# Patient Record
Sex: Male | Born: 1955 | Race: White | Hispanic: No | Marital: Married | Smoking: Never smoker
Health system: Southern US, Community
[De-identification: ages and names within clinical notes are randomized; demographics above are authoritative.]

## PROBLEM LIST (undated history)

## (undated) DIAGNOSIS — M1120 Other chondrocalcinosis, unspecified site: Secondary | ICD-10-CM

## (undated) DIAGNOSIS — Z85528 Personal history of other malignant neoplasm of kidney: Secondary | ICD-10-CM

## (undated) DIAGNOSIS — Z836 Family history of other diseases of the respiratory system: Secondary | ICD-10-CM

## (undated) DIAGNOSIS — M1990 Unspecified osteoarthritis, unspecified site: Secondary | ICD-10-CM

## (undated) DIAGNOSIS — K859 Acute pancreatitis without necrosis or infection, unspecified: Secondary | ICD-10-CM

## (undated) DIAGNOSIS — K219 Gastro-esophageal reflux disease without esophagitis: Secondary | ICD-10-CM

## (undated) HISTORY — DX: Personal history of other malignant neoplasm of kidney: Z85.528

## (undated) HISTORY — PX: OTHER SURGICAL HISTORY: SHX169

## (undated) HISTORY — DX: Acute pancreatitis without necrosis or infection, unspecified: K85.90

---

## 2015-01-31 ENCOUNTER — Ambulatory Visit: Payer: 59 | Attending: Sports Medicine

## 2015-01-31 DIAGNOSIS — M25561 Pain in right knee: Secondary | ICD-10-CM | POA: Diagnosis not present

## 2015-01-31 NOTE — Therapy (Signed)
[place], [place], [address] Phone: [phone]   Fax:  [phone]  Physical Therapy Evaluation  Patient Details  Name: Louis Fox MRN: [id number] Date of Birth: [birth year] Referring Provider:  Verner Chol, MD  Encounter Date: 01/31/2015      PT End of Session - 01/31/15 1316    Visit Number 1   Number of Visits 8   Date for PT Re-Evaluation 03/01/15   PT Start Time 9470   PT Stop Time 1312   PT Time Calculation (min) 37 min   Activity Tolerance Patient tolerated treatment well   Behavior During Therapy [facility] for tasks assessed/performed      No past medical history on file.  No past surgical history on file.  There were no vitals taken for this visit.  Visit Diagnosis:  Pain, joint, knee, right - Plan: PT plan of care cert/re-cert      Subjective Assessment - 01/31/15 1239    Symptoms He feels it was a gout flairup and began to have bilateral knee pain  Now he has some RT knee pain due to use of life .    Pertinent History Gout RT knee pain episode in past. He has to step up railing multiple times per day. He has been taping patella with much benefit with medial pull.    How long can you sit comfortably? As needed   How long can you walk comfortably? As needed but no long distances for exercise (1-2 miles)   Diagnostic tests Xrays    Patient Stated Goals Decreased pain   Currently in Pain? Yes   Pain Score 1    Pain Orientation Right   Pain Descriptors / Indicators Aching   Pain Type --  Sub acute   Pain Onset More than a month ago   Aggravating Factors  Twisting on leg   Pain Relieving Factors Ice ,meds, tape   Effect of Pain on Daily Activities Not walking long distances   Multiple Pain Sites No          [facility] PT Assessment - 01/31/15 1247    Assessment   Medical Diagnosis RT knee swelling, LT knee  contusion/bursitis   Onset Date --  11/2015   Prior Therapy None   Precautions   Precautions None   Restrictions   Weight Bearing Restrictions No   Balance Screen   Has the patient fallen in the past 6 months No   Has the patient had a decrease in activity level because of a fear of falling?  No   Is the patient reluctant to leave their home because of a fear of falling?  No   AROM   Right Knee Extension 180   Left Knee Extension 180   Strength   Right Knee Flexion 5/5   Right Knee Extension 5/5   Left Knee Flexion 5/5   Left Knee Extension 5/5                  [facility] Adult PT Treatment/Exercise - 01/31/15 0001    Iontophoresis   Type of Iontophoresis Dexamethasone   Location medial RT knee   Dose 2cc   Time 4-5 hours                 PT Education - 01/31/15 1316    Education provided Yes   Education Details ionto use and removal.    Person(s) Educated Patient   Methods Explanation  Comprehension Verbalized understanding             PT Long Term Goals - 01/31/15 1319    PT LONG TERM GOAL #1   Title Indepenent wiht all HEP as of last visit   Time 4   Period Weeks   Status New   PT LONG TERM GOAL #2   Title Pain decreased 75% or more to return to wlaking 1 mile or more    Time 4   Period Weeks   Status New               Plan - 01/31/15 1317    Clinical Impression Statement Mild medial knee pain along edge of femoral condyle   Pt will benefit from skilled therapeutic intervention in order to improve on the following deficits Pain;Difficulty walking   Rehab Potential Good   PT Frequency 2x / week   PT Duration 4 weeks   PT Treatment/Interventions Patient/family education;Therapeutic exercise;Ultrasound;Manual techniques  iontophoresis   PT Next Visit Plan [place], ionto, hip strength, review tape   Consulted and Agree with Plan of Care Patient         Problem List There are no active problems to display for this patient.   Darrel Hoover PT  01/31/2015, 1:21 PM  [facility] [address] [place], [place], [address] Phone: [phone]   Fax:  [phone]

## 2015-01-31 NOTE — Patient Instructions (Signed)

## 2015-02-12 ENCOUNTER — Ambulatory Visit: Payer: 59

## 2015-02-12 DIAGNOSIS — M25561 Pain in right knee: Secondary | ICD-10-CM

## 2015-02-12 NOTE — Patient Instructions (Signed)
Hip Extension: Hamstring Single Leg Deadlift (Eccentric)   Holding weights, stand on affected leg with knee slightly flexed. Lift other leg while slowly bending forward at the hip. Use ___ lb weight. ___ reps per set, ___ sets per day, ___ days per week. Add ___ lbs when you achieve ___ repetitions. Touch floor with weight.  Copyright  VHI. All rights reserved.  Squat: Wide Leg   Feet wide apart, toes out, squat, holding weight between knees. Weight should be at ankles.  Bend at the hips with hips going back behind hips. Keep back straight.  Put eight on stool if you cannot get to floor.  Repeat _3-15___ times per set. Do ___1-2_ sets per session. Do __2-5__ sessions per week. Use __0-10__ lb weight.  Copyright  VHI. All rights reserved.  Squat: Half   Arms hanging at sides, squat by dropping hips back as if sitting on a chair. Keep knees over ankles, hips behind heels.  Keep back straight.  Bend at hips and knees will bend with hips.   Repeat ___3-15_ times per set. Do __1-2__ sets per session. Do __2-5__ sessions per week. Use __0-10HIP / KNEE: Flexion / Extension, Squat Unilateral Hip / Glute Extension: Standing - Straight Leg (Machine) Hip Extension (Standing) Healthy Back - Yoga Tree Balance  

## 2015-02-12 NOTE — Therapy (Signed)
[place], [place], [address] Phone: [phone]   Fax:  [phone]  Physical Therapy Treatment  Patient Details  Name: Louis Fox MRN: [id number] Date of Birth: 28-Mar-1956 Referring Provider:  Verner Chol, MD  Encounter Date: 02/12/2015      PT End of Session - 02/12/15 1138    Visit Number 2   Number of Visits 8   Date for PT Re-Evaluation 03/01/15   PT Start Time 1100   PT Stop Time 1140   PT Time Calculation (min) 40 min   Activity Tolerance Patient tolerated treatment well   Behavior During Therapy [facility] for tasks assessed/performed      No past medical history on file.  No past surgical history on file.  There were no vitals taken for this visit.  Visit Diagnosis:  Pain, joint, knee, right      Subjective Assessment - 02/12/15 1056    Symptoms Much better    Currently in Pain? Yes   Pain Score 1    Pain Orientation Right   Multiple Pain Sites No                    [facility] Adult PT Treatment/Exercise - 02/12/15 1140    Exercises   Exercises --  hip hinge double and single leg    Ultrasound   Ultrasound Location Medial RT knee   Ultrasound Parameters 100% 3 MHz 1.2 Wcm2   Ultrasound Goals Pain   Iontophoresis   Type of Iontophoresis Dexamethasone   Location medial RT knee   Dose 2cc   Time 4-5 hours                 PT Education - 02/12/15 1138    Education provided Yes   Education Details hip hinge   Person(s) Educated Patient   Methods Explanation;Demonstration;Tactile cues;Verbal cues;Handout   Comprehension Returned demonstration;Verbalized understanding             PT Long Term Goals - 01/31/15 1319    PT LONG TERM GOAL #1   Title Indepenent wiht all HEP as of last visit   Time 4   Period Weeks   Status New   PT LONG TERM GOAL #2   Title Pain decreased 75% or more to return to wlaking 1 mile or more    Time 4   Period Weeks    Status New               Plan - 02/12/15 1139    Clinical Impression Statement Pain improving .    Consulted and Agree with Plan of Care Patient        Problem List There are no active problems to display for this patient.   Darrel Hoover PT 02/12/2015, 11:42 AM  [facility] [address] [place], [place], [address] Phone: [phone]   Fax:  [phone]

## 2015-02-14 ENCOUNTER — Ambulatory Visit: Payer: 59

## 2015-02-14 DIAGNOSIS — M25561 Pain in right knee: Secondary | ICD-10-CM | POA: Diagnosis not present

## 2015-02-14 NOTE — Therapy (Signed)
[place], [place], [address] Phone: [phone]   Fax:  [phone]  Physical Therapy Treatment  Patient Details  Name: Louis Fox MRN: [id number] Date of Birth: 1956-03-15 Referring Provider:  Verner Chol, MD  Encounter Date: 02/14/2015      PT End of Session - 02/14/15 1054    Visit Number 3   Number of Visits 8   Date for PT Re-Evaluation 03/01/15   PT Start Time 0930   PT Stop Time 1005   PT Time Calculation (min) 35 min   Activity Tolerance Patient tolerated treatment well   Behavior During Therapy [facility] for tasks assessed/performed      No past medical history on file.  No past surgical history on file.  There were no vitals taken for this visit.  Visit Diagnosis:  Pain, joint, knee, right                  [facility] Adult PT Treatment/Exercise - 02/14/15 1052    Ultrasound   Ultrasound Location medial RT knee   Ultrasound Parameters 100% 3 MHZ 1.2 Wcm2   Ultrasound Goals Pain   Iontophoresis   Type of Iontophoresis Dexamethasone   Location medial RT knee   Dose 2cc   Time 4-5 hours                      PT Long Term Goals - 01/31/15 1319    PT LONG TERM GOAL #1   Title Indepenent wiht all HEP as of last visit   Time 4   Period Weeks   Status New   PT LONG TERM GOAL #2   Title Pain decreased 75% or more to return to wlaking 1 mile or more    Time 4   Period Weeks   Status New               Plan - 02/14/15 1054    Clinical Impression Statement No real pain but wants to have another treatment and see how does into next week. Did the exercise without increase pain   PT Next Visit Plan modalities    Consulted and Agree with Plan of Care Patient        Problem List There are no active problems to display for this patient.   Darrel Hoover PT 02/14/2015, 10:56 AM  [facility] [address] [place], [place], [address] Phone: [phone]   Fax:  [phone]

## 2015-02-18 ENCOUNTER — Telehealth: Payer: Self-pay | Admitting: *Deleted

## 2015-02-18 NOTE — Therapy (Addendum)
[place], [place], [address] Phone: [phone]   Fax:  [phone]  Physical Therapy Treatment  Patient Details  Name: Louis Fox MRN: [id number] Date of Birth: 06-03-1956 Referring Provider:  Verner Chol, MD  Encounter Date: 02/14/2015    No past medical history on file.  No past surgical history on file.  There were no vitals taken for this visit.  Visit Diagnosis:  Pain, joint, knee, right                               PT Long Term Goals - 01/31/15 1319    PT LONG TERM GOAL #1   Title Indepenent wiht all HEP as of last visit   Time 4   Period Weeks   Status New   PT LONG TERM GOAL #2   Title Pain decreased 75% or more to return to wlaking 1 mile or more    Time 4   Period Weeks   Status New        .PHYSICAL THERAPY DISCHARGE SUMMARY  Visits from Start of Care: 5  Current functional level related to goals / functional outcomes: He reports he is doing well and discharged self. Goal met   Remaining deficits: None   Education / Equipment: HEP  Plan: Patient agrees to discharge.  Patient goals were met. Patient is being discharged due to being pleased with the current functional level.  ?????           Problem List There are no active problems to display for this patient.   Darrel Hoover PT 02/18/2015, 1:28 PM  [facility] [address] [place], [place], [address] Phone: [phone]   Fax:  [phone]

## 2015-02-18 NOTE — Telephone Encounter (Signed)
pt called to cancel all his appts stated that he's doing much better...td

## 2015-09-24 ENCOUNTER — Encounter (HOSPITAL_COMMUNITY): Payer: Self-pay | Admitting: Emergency Medicine

## 2015-09-24 ENCOUNTER — Observation Stay (HOSPITAL_COMMUNITY)
Admission: EM | Admit: 2015-09-24 | Discharge: 2015-09-28 | Disposition: A | Discharge status: Home / Self Care | Payer: 59 | Attending: Internal Medicine | Admitting: Internal Medicine

## 2015-09-24 ENCOUNTER — Emergency Department (HOSPITAL_COMMUNITY): Payer: 59

## 2015-09-24 DIAGNOSIS — K2980 Duodenitis without bleeding: Secondary | ICD-10-CM | POA: Diagnosis present

## 2015-09-24 DIAGNOSIS — F1010 Alcohol abuse, uncomplicated: Secondary | ICD-10-CM | POA: Insufficient documentation

## 2015-09-24 DIAGNOSIS — R1011 Right upper quadrant pain: Secondary | ICD-10-CM | POA: Diagnosis not present

## 2015-09-24 DIAGNOSIS — D72829 Elevated white blood cell count, unspecified: Secondary | ICD-10-CM | POA: Diagnosis not present

## 2015-09-24 DIAGNOSIS — N2889 Other specified disorders of kidney and ureter: Secondary | ICD-10-CM | POA: Diagnosis present

## 2015-09-24 DIAGNOSIS — R112 Nausea with vomiting, unspecified: Secondary | ICD-10-CM | POA: Insufficient documentation

## 2015-09-24 DIAGNOSIS — R109 Unspecified abdominal pain: Secondary | ICD-10-CM | POA: Diagnosis present

## 2015-09-24 DIAGNOSIS — E871 Hypo-osmolality and hyponatremia: Secondary | ICD-10-CM | POA: Insufficient documentation

## 2015-09-24 HISTORY — DX: Other chondrocalcinosis, unspecified site: M11.20

## 2015-09-24 LAB — COMPREHENSIVE METABOLIC PANEL
ALT: 10 U/L — ABNORMAL LOW (ref 17–63)
AST: 22 U/L (ref 15–41)
Albumin: 3.7 g/dL (ref 3.5–5.0)
Alkaline Phosphatase: 77 U/L (ref 38–126)
Anion gap: 10 (ref 5–15)
BUN: 13 mg/dL (ref 6–20)
CO2: 26 mmol/L (ref 22–32)
Calcium: 9.3 mg/dL (ref 8.9–10.3)
Chloride: 91 mmol/L — ABNORMAL LOW (ref 101–111)
Creatinine, Ser: 0.73 mg/dL (ref 0.61–1.24)
GFR calc Af Amer: >60 mL/min (ref >60)
GFR calc non Af Amer: >60 mL/min (ref >60)
Glucose, Bld: 135 mg/dL — ABNORMAL HIGH (ref 65–99)
Potassium: 4 mmol/L (ref 3.5–5.1)
Sodium: 127 mmol/L — ABNORMAL LOW (ref 135–145)
Total Bilirubin: 1.5 mg/dL — ABNORMAL HIGH (ref 0.3–1.2)
Total Protein: 6.5 g/dL (ref 6.5–8.1)

## 2015-09-24 LAB — URINALYSIS, ROUTINE W REFLEX MICROSCOPIC
Bilirubin Urine: NEGATIVE
Glucose, UA: NEGATIVE mg/dL
Ketones, ur: NEGATIVE mg/dL
Leukocytes, UA: NEGATIVE
Nitrite: NEGATIVE
Protein, ur: 100 mg/dL — AB
Specific Gravity, Urine: 1.031 — ABNORMAL HIGH (ref 1.005–1.030)
Urobilinogen, UA: 0.2 mg/dL (ref 0.0–1.0)
pH: 6 (ref 5.0–8.0)

## 2015-09-24 LAB — CBC
HCT: 40.3 % (ref 39.0–52.0)
Hemoglobin: 14.2 g/dL (ref 13.0–17.0)
MCH: 31.6 pg (ref 26.0–34.0)
MCHC: 35.2 g/dL (ref 30.0–36.0)
MCV: 89.6 fL (ref 78.0–100.0)
Platelets: 364 10*3/uL (ref 150–400)
RBC: 4.5 MIL/uL (ref 4.22–5.81)
RDW: 12.5 % (ref 11.5–15.5)
WBC: 29.4 10*3/uL — ABNORMAL HIGH (ref 4.0–10.5)

## 2015-09-24 LAB — URINE MICROSCOPIC-ADD ON

## 2015-09-24 LAB — I-STAT CG4 LACTIC ACID, ED
Lactic Acid, Venous: 1.53 mmol/L (ref 0.5–2.0)
Lactic Acid, Venous: 1.04 mmol/L (ref 0.5–2.0)

## 2015-09-24 LAB — LIPASE, BLOOD: Lipase: 23 U/L (ref 22–51)

## 2015-09-24 MED ORDER — ONDANSETRON HCL 4 MG/2ML IJ SOLN
4.0000 mg | Freq: Once | INTRAMUSCULAR | Status: AC
  Administered 2015-09-24: 4 mg via INTRAVENOUS
  Filled 2015-09-24: qty 2

## 2015-09-24 MED ORDER — MORPHINE SULFATE (PF) 4 MG/ML IV SOLN
8.0000 mg | Freq: Once | INTRAVENOUS | Status: AC
  Administered 2015-09-24: 8 mg via INTRAVENOUS
  Filled 2015-09-24: qty 2

## 2015-09-24 MED ORDER — IOHEXOL 300 MG/ML  SOLN
100.0000 mL | Freq: Once | INTRAMUSCULAR | Status: AC | PRN
  Administered 2015-09-24: 100 mL via INTRAVENOUS

## 2015-09-24 MED ORDER — SODIUM CHLORIDE 0.9 % IV BOLUS (SEPSIS)
2000.0000 mL | Freq: Once | INTRAVENOUS | Status: AC
  Administered 2015-09-24: 2000 mL via INTRAVENOUS

## 2015-09-24 NOTE — ED Notes (Signed)
Pt sts abd pain with cramping x 2 days; pt sts N/V prior to that; pt sts no BM as well

## 2015-09-24 NOTE — ED Notes (Signed)
Patient transported to CT 

## 2015-09-24 NOTE — ED Provider Notes (Signed)
CSN: [id number]     Arrival date & time 09/24/15  1417 History   First MD Initiated Contact with Patient 09/24/15 1652     Chief Complaint  Patient presents with  . Abdominal Pain     (Consider location/radiation/quality/duration/timing/severity/associated sxs/prior Treatment) Patient is a 59 y.o. male presenting with abdominal pain. The history is provided by the patient.  Abdominal Pain Pain location:  LLQ and RLQ Pain quality: cramping   Pain radiates to:  Does not radiate Pain severity:  Severe Onset quality:  Gradual Duration:  3 days Timing:  Constant Progression:  Worsening Chronicity:  New Relieved by:  Nothing Worsened by:  Nothing tried Ineffective treatments:  None tried Associated symptoms: constipation, nausea and vomiting   Associated symptoms: no chest pain, no chills, no diarrhea, no fever and no shortness of breath    59 yo M with a chief complaint of abdominal pain. This been going on for 3 days. The pain is crampy lower. Patient denies any fevers chills. Has had persistent vomiting since this pain started. Difficulty tolerating anything at home. Saw his PCP and was given Phenergan suppositories with transient relief. Continued to vomit throughout today called the family doctor and was unable to get an appointment. Then decided to come to the ED.   History reviewed. No pertinent past medical history. History reviewed. No pertinent past surgical history. History reviewed. No pertinent family history. Social History  Substance Use Topics  . Smoking status: Never Smoker   . Smokeless tobacco: None  . Alcohol Use: Yes    Review of Systems  Constitutional: Negative for fever and chills.  HENT: Negative for congestion and facial swelling.   Eyes: Negative for discharge and visual disturbance.  Respiratory: Negative for shortness of breath.   Cardiovascular: Negative for chest pain and palpitations.  Gastrointestinal: Positive for nausea, vomiting and  constipation. Negative for abdominal pain and diarrhea.  Musculoskeletal: Negative for myalgias and arthralgias.  Skin: Negative for color change and rash.  Neurological: Negative for tremors, syncope and headaches.  Psychiatric/Behavioral: Negative for confusion and dysphoric mood.      Allergies  Shellfish allergy  Home Medications   Prior to Admission medications   Medication Sig Start Date End Date Taking? Authorizing Provider  acetaminophen (TYLENOL) 500 MG tablet Take 1,000 mg by mouth every 6 (six) hours as needed for mild pain.   Yes Historical Provider, MD  anti-nausea (EMETROL) solution Take 15 mLs by mouth every 15 (fifteen) minutes as needed for nausea or vomiting.   Yes Historical Provider, MD  meloxicam (MOBIC) 15 MG tablet Take 15 mg by mouth daily.   Yes Historical Provider, MD  promethazine (PHENERGAN) 25 MG suppository Place 25 mg rectally every 6 (six) hours as needed for nausea or vomiting.   Yes Historical Provider, MD  ULTRAM 50 MG tablet Take 50 mg by mouth 2 (two) times daily. 08/20/15  Yes Historical Provider, MD   BP 147/70 mmHg  Pulse 80  Temp(Src) 98.2 F (36.8 C) (Oral)  Resp 18  SpO2 95% Physical Exam  Constitutional: He is oriented to person, place, and time. He appears well-developed and well-nourished.  HENT:  Head: Normocephalic and atraumatic.  Eyes: EOM are normal. Pupils are equal, round, and reactive to light.  Neck: Normal range of motion. Neck supple. No JVD present.  Cardiovascular: Normal rate and regular rhythm.  Exam reveals no gallop and no friction rub.   No murmur heard. Pulmonary/Chest: No respiratory distress. He has no wheezes.  Abdominal: He exhibits no distension. There is tenderness. There is guarding. There is no rebound.  Positive Murphy sign, pain worse in the right upper quadrant  Musculoskeletal: Normal range of motion.  Neurological: He is alert and oriented to person, place, and time.  Skin: No rash noted. No pallor.   Psychiatric: He has a normal mood and affect. His behavior is normal.    ED Course  Procedures (including critical care time) Labs Review Labs Reviewed  COMPREHENSIVE METABOLIC PANEL - Abnormal; Notable for the following:    Sodium 127 (*)    Chloride 91 (*)    Glucose, Bld 135 (*)    ALT 10 (*)    Total Bilirubin 1.5 (*)    All other components within normal limits  CBC - Abnormal; Notable for the following:    WBC 29.4 (*)    All other components within normal limits  URINALYSIS, ROUTINE W REFLEX MICROSCOPIC (NOT AT [facility]) - Abnormal; Notable for the following:    Color, Urine AMBER (*)    APPearance CLOUDY (*)    Specific Gravity, Urine 1.031 (*)    Hgb urine dipstick TRACE (*)    Protein, ur 100 (*)    All other components within normal limits  URINE MICROSCOPIC-ADD ON - Abnormal; Notable for the following:    Squamous Epithelial / LPF FEW (*)    Bacteria, UA FEW (*)    All other components within normal limits  LIPASE, BLOOD  H. PYLORI ANTIBODY, IGG  I-STAT CG4 LACTIC ACID, ED  I-STAT CG4 LACTIC ACID, ED    Imaging Review Ct Abdomen Pelvis W Contrast  09/25/2015   CLINICAL DATA:  Generalized abdominal pain, worse in the right and left lower quadrant. Nausea and vomiting. Symptoms for 3 days.  EXAM: CT ABDOMEN AND PELVIS WITH CONTRAST  TECHNIQUE: Multidetector CT imaging of the abdomen and pelvis was performed using the standard protocol following bolus administration of intravenous contrast.  CONTRAST:  125mL OMNIPAQUE IOHEXOL 300 MG/ML  SOLN  COMPARISON:  Abdominal ultrasound earlier this day.  FINDINGS: Lower chest: Multifocal linear atelectasis in both lower lobes. No pleural effusion.  Liver: Normal in size and density without focal lesion.  Hepatobiliary: Gallbladder physiologically distended, no calcified stone. No biliary dilatation.  Pancreas: Homogeneous enhancement. Extensive fat stranding and inflammatory change about the head, uncinate process, and proximal body.  No ductal dilatation. Fat stranding extends to involve the lesser sac and proximal mesentery. There is adjacent tracking free fluid, however it no pancreatic pseudocyst. Splenic vein remains patent.  Spleen: Normal.  Adrenal glands: No nodule.  Kidneys: Symmetric renal enhancement. No hydronephrosis. There is an heterogeneous 2 cm lesion in the anterior mid lower right kidney corresponding to the hyperechoic lesion on ultrasound. Tiny subcentimeter cyst in the lower left kidney.  Stomach/Bowel: Stomach is decompressed, mild wall thickening is seen involving the gastric body. There is thickening of the included distal esophagus. Diffuse wall thickening of the entire duodenum. Adjacent soft tissue stranding involving the proximal mesentery and lesser sac with small amount tracking fluid. No pneumoperitoneum or free air. The more distal small bowel loops are decompressed. Small volume of stool in the proximal colon. Equivocal wall thickening of the ascending colon, likely reactive. The distal colon is decompressed. The appendix is prominent in size proximally measuring 8-9 mm, however normal in caliber distally in there is no surrounding inflammatory change.  Vascular/Lymphatic: No retroperitoneal adenopathy. Abdominal aorta is normal in caliber. Mild atherosclerosis of the abdominal aorta without aneurysm.  Reproductive: Prostate gland is enlarged measuring 6.0 x 4.1 cm.  Bladder: Physiologically distended, no wall thickening.  Other: Small amount of free fluid in the pelvis, likely tracking from the upper abdominal inflammation. No abscess or loculated fluid collection.  Musculoskeletal: There are no acute or suspicious osseous abnormalities. Chondrocalcinosis incidentally noted about the pubic symphysis. There is degenerative disc disease at L5-S1.  IMPRESSION: 1. Marked inflammation with soft tissue stranding and free fluid in the upper abdomen involving the retroperitoneum and lesser sac. There is diffuse duodenum  wall thickening. Findings likely reflect duodenitis versus less likely pancreatitis given the normal pancreatic enzymes. No perforation. 2. Heterogeneous 2 cm lesion in the right kidney corresponding to that seen on ultrasound. Findings are concerning for solid renal mass with likely enhancement, however enhancement characteristics are difficult to assess on the current exam. Recommend renal protocol MRI after acute symptoms have resolved. 3. Enlarged prostate gland.   Electronically Signed   By: Jeb Levering M.D.   On: 09/25/2015 00:04   Dg Abd Acute W/chest  09/24/2015   CLINICAL DATA:  Nausea and vomiting for 3 days.  Constipation.  EXAM: DG ABDOMEN ACUTE W/ 1V CHEST  COMPARISON:  None.  FINDINGS: PA chest: There is slight atelectasis in the left base. Lungs elsewhere clear. Heart size and pulmonary vascularity are normal. No adenopathy.  Supine and upright abdomen: There is fairly diffuse stool throughout colon. Colon does not appear appreciably distended with stool, however. There is no bowel dilatation or air-fluid level suggesting obstruction. No free air. There are phleboliths in the pelvis.  IMPRESSION: Diffuse stool throughout the colon without colonic distention from stool. No obstruction or free air. Mild left base atelectasis. No frank edema or consolidation.   Electronically Signed   By: Lowella Grip III M.D.   On: 09/24/2015 17:39   US Abdomen Limited Ruq  09/24/2015   CLINICAL DATA:  Abdomen pain /cramping for 2 days  EXAM: US ABDOMEN LIMITED - RIGHT UPPER QUADRANT  COMPARISON:  None.  FINDINGS: Gallbladder:  No gallstones or wall thickening visualized. No sonographic Murphy sign noted.  Common bile duct:  Diameter: 3 mm  Liver:  No focal lesion identified. Within normal limits in parenchymal echogenicity.  There is incidental finding of a slight hyperechoic lesion in the right kidney measuring 1.6 x 1.7 x 2 cm.  IMPRESSION: No acute abnormality.  Incidental finding of a slight  hyperechoic 2 cm mass in the right kidney. Recommend further evaluation with three-phase kidney CT on outpatient basis.   Electronically Signed   By: Abelardo Diesel M.D.   On: 09/24/2015 20:53   I have personally reviewed and evaluated these images and lab results as part of my medical decision-making.   EKG Interpretation None      MDM   Final diagnoses:  RUQ pain    59 yo M with a chief complaint of abdominal pain and vomiting. Patient with leukocytosis of 30,000. Significantly dehydrated on his CMP. Positive Murphy sign we'll obtain an ultrasound. Treat pain and nausea.  Ultrasound negative for acute cholecystitis. Will obtain a CT scan with contrast.  CT scan with acute duodenitis. Will treat with antibiotics. Discussed with patient still feeling significant pain. Will admit to the hospital.  The patients results and plan were reviewed and discussed.   Any x-rays performed were independently reviewed by myself.   Differential diagnosis were considered with the presenting HPI.  Medications  pantoprazole (PROTONIX) injection 40 mg (not administered)  alum & mag  hydroxide-simeth (MAALOX/MYLANTA) 200-200-20 MG/5ML suspension 15 mL (not administered)  sodium chloride 0.9 % bolus 2,000 mL (2,000 mLs Intravenous New Bag/Given 09/24/15 1704)  ondansetron (ZOFRAN) injection 4 mg (4 mg Intravenous Given 09/24/15 1709)  morphine 4 MG/ML injection 8 mg (8 mg Intravenous Given 09/24/15 1709)  morphine 4 MG/ML injection 8 mg (8 mg Intravenous Given 09/24/15 1944)  iohexol (OMNIPAQUE) 300 MG/ML solution 100 mL (100 mLs Intravenous Contrast Given 09/24/15 2315)  amoxicillin (AMOXIL) capsule 1,000 mg (1,000 mg Oral Given 09/25/15 0102)  clarithromycin (BIAXIN) tablet 500 mg (500 mg Oral Given 09/25/15 0102)  morphine 4 MG/ML injection 8 mg (8 mg Intravenous Given 09/25/15 0102)  ondansetron (ZOFRAN) injection 4 mg (4 mg Intravenous Given 09/25/15 0102)    Filed Vitals:   09/24/15 2215 09/24/15 2230  09/24/15 2245 09/24/15 2300  BP: 154/80 148/77 142/67 147/70  Pulse: 79 75 74 80  Temp:      TempSrc:      Resp:      SpO2: 96% 93% 94% 95%    Final diagnoses:  RUQ pain    Admission/ observation were discussed with the admitting physician, patient and/or family and they are comfortable with the plan.    Deno Etienne, DO 09/25/15 8937

## 2015-09-25 ENCOUNTER — Encounter (HOSPITAL_COMMUNITY): Payer: Self-pay | Admitting: Internal Medicine

## 2015-09-25 DIAGNOSIS — R1011 Right upper quadrant pain: Secondary | ICD-10-CM | POA: Diagnosis not present

## 2015-09-25 DIAGNOSIS — K2980 Duodenitis without bleeding: Secondary | ICD-10-CM | POA: Diagnosis not present

## 2015-09-25 DIAGNOSIS — E871 Hypo-osmolality and hyponatremia: Secondary | ICD-10-CM | POA: Diagnosis not present

## 2015-09-25 DIAGNOSIS — N2889 Other specified disorders of kidney and ureter: Secondary | ICD-10-CM | POA: Diagnosis present

## 2015-09-25 DIAGNOSIS — D72829 Elevated white blood cell count, unspecified: Secondary | ICD-10-CM | POA: Diagnosis present

## 2015-09-25 DIAGNOSIS — R1013 Epigastric pain: Secondary | ICD-10-CM | POA: Diagnosis not present

## 2015-09-25 DIAGNOSIS — R109 Unspecified abdominal pain: Secondary | ICD-10-CM | POA: Diagnosis present

## 2015-09-25 LAB — GLUCOSE, CAPILLARY
Glucose-Capillary: 119 mg/dL — ABNORMAL HIGH (ref 65–99)
Glucose-Capillary: 114 mg/dL — ABNORMAL HIGH (ref 65–99)
Glucose-Capillary: 101 mg/dL — ABNORMAL HIGH (ref 65–99)
Glucose-Capillary: 128 mg/dL — ABNORMAL HIGH (ref 65–99)

## 2015-09-25 LAB — LIPASE, BLOOD: Lipase: 19 U/L — ABNORMAL LOW (ref 22–51)

## 2015-09-25 LAB — HEPATIC FUNCTION PANEL
ALT: 8 U/L — ABNORMAL LOW (ref 17–63)
AST: 17 U/L (ref 15–41)
Albumin: 3 g/dL — ABNORMAL LOW (ref 3.5–5.0)
Alkaline Phosphatase: 67 U/L (ref 38–126)
Bilirubin, Direct: 0.4 mg/dL (ref 0.1–0.5)
Indirect Bilirubin: 0.8 mg/dL (ref 0.3–0.9)
Total Bilirubin: 1.2 mg/dL (ref 0.3–1.2)
Total Protein: 5.4 g/dL — ABNORMAL LOW (ref 6.5–8.1)

## 2015-09-25 LAB — CBC WITH DIFFERENTIAL/PLATELET
Basophils Absolute: 0 10*3/uL (ref 0.0–0.1)
Basophils Relative: 0 %
Eosinophils Absolute: 0 10*3/uL (ref 0.0–0.7)
Eosinophils Relative: 0 %
HCT: 37 % — ABNORMAL LOW (ref 39.0–52.0)
Hemoglobin: 12.9 g/dL — ABNORMAL LOW (ref 13.0–17.0)
Lymphocytes Relative: 5 %
Lymphs Abs: 1.1 10*3/uL (ref 0.7–4.0)
MCH: 31.7 pg (ref 26.0–34.0)
MCHC: 34.9 g/dL (ref 30.0–36.0)
MCV: 90.9 fL (ref 78.0–100.0)
Monocytes Absolute: 1.7 10*3/uL — ABNORMAL HIGH (ref 0.1–1.0)
Monocytes Relative: 7 %
Neutro Abs: 20.1 10*3/uL — ABNORMAL HIGH (ref 1.7–7.7)
Neutrophils Relative %: 88 %
Platelets: 251 10*3/uL (ref 150–400)
RBC: 4.07 MIL/uL — ABNORMAL LOW (ref 4.22–5.81)
RDW: 12.5 % (ref 11.5–15.5)
WBC: 23 10*3/uL — ABNORMAL HIGH (ref 4.0–10.5)

## 2015-09-25 LAB — TROPONIN I
Troponin I: 0.06 ng/mL — ABNORMAL HIGH (ref <0.031)
Troponin I: <0.03 ng/mL (ref <0.031)
Troponin I: 0.1 ng/mL — ABNORMAL HIGH (ref <0.031)

## 2015-09-25 MED ORDER — FOLIC ACID 1 MG PO TABS
1.0000 mg | ORAL_TABLET | Freq: Every day | ORAL | Status: DC
  Administered 2015-09-25 – 2015-09-28 (×4): 1 mg via ORAL
  Filled 2015-09-25 (×4): qty 1

## 2015-09-25 MED ORDER — ADULT MULTIVITAMIN W/MINERALS CH
1.0000 | ORAL_TABLET | Freq: Every day | ORAL | Status: DC
  Administered 2015-09-25 – 2015-09-28 (×4): 1 via ORAL
  Filled 2015-09-25 (×4): qty 1

## 2015-09-25 MED ORDER — SODIUM CHLORIDE 0.9 % IV SOLN
INTRAVENOUS | Status: AC
  Administered 2015-09-25 (×3): via INTRAVENOUS

## 2015-09-25 MED ORDER — PANTOPRAZOLE SODIUM 40 MG IV SOLR: 40.0000 mg | Freq: Two times a day (BID) | INTRAVENOUS | Status: DC

## 2015-09-25 MED ORDER — PANTOPRAZOLE SODIUM 40 MG IV SOLR
40.0000 mg | Freq: Once | INTRAVENOUS | Status: AC
  Administered 2015-09-25: 40 mg via INTRAVENOUS
  Filled 2015-09-25: qty 40

## 2015-09-25 MED ORDER — MORPHINE SULFATE (PF) 2 MG/ML IV SOLN
1.0000 mg | INTRAVENOUS | Status: DC | PRN
  Administered 2015-09-25 (×2): 1 mg via INTRAVENOUS
  Filled 2015-09-25 (×2): qty 1

## 2015-09-25 MED ORDER — ONDANSETRON HCL 4 MG PO TABS: 4.0000 mg | ORAL_TABLET | Freq: Four times a day (QID) | ORAL | Status: DC | PRN

## 2015-09-25 MED ORDER — MORPHINE SULFATE (PF) 4 MG/ML IV SOLN
8.0000 mg | Freq: Once | INTRAVENOUS | Status: AC
  Administered 2015-09-25: 8 mg via INTRAVENOUS
  Filled 2015-09-25: qty 2

## 2015-09-25 MED ORDER — THIAMINE HCL 100 MG/ML IJ SOLN: 100.0000 mg | Freq: Every day | INTRAMUSCULAR | Status: DC

## 2015-09-25 MED ORDER — CIPROFLOXACIN IN D5W 400 MG/200ML IV SOLN
400.0000 mg | Freq: Two times a day (BID) | INTRAVENOUS | Status: DC
  Administered 2015-09-25 – 2015-09-28 (×7): 400 mg via INTRAVENOUS
  Filled 2015-09-25 (×7): qty 200

## 2015-09-25 MED ORDER — LORAZEPAM 2 MG/ML IJ SOLN: 1.0000 mg | Freq: Four times a day (QID) | INTRAMUSCULAR | Status: DC | PRN

## 2015-09-25 MED ORDER — LORAZEPAM 1 MG PO TABS: 1.0000 mg | ORAL_TABLET | Freq: Four times a day (QID) | ORAL | Status: DC | PRN

## 2015-09-25 MED ORDER — POTASSIUM CHLORIDE CRYS ER 20 MEQ PO TBCR
40.0000 meq | EXTENDED_RELEASE_TABLET | ORAL | Status: AC
  Administered 2015-09-25 (×2): 40 meq via ORAL
  Filled 2015-09-25: qty 2

## 2015-09-25 MED ORDER — ONDANSETRON HCL 4 MG/2ML IJ SOLN: 4.0000 mg | Freq: Four times a day (QID) | INTRAMUSCULAR | Status: DC | PRN

## 2015-09-25 MED ORDER — ACETAMINOPHEN 650 MG RE SUPP: 650.0000 mg | Freq: Four times a day (QID) | RECTAL | Status: DC | PRN

## 2015-09-25 MED ORDER — METRONIDAZOLE IN NACL 5-0.79 MG/ML-% IV SOLN
500.0000 mg | Freq: Three times a day (TID) | INTRAVENOUS | Status: DC
  Administered 2015-09-25 – 2015-09-28 (×9): 500 mg via INTRAVENOUS
  Filled 2015-09-25 (×9): qty 100

## 2015-09-25 MED ORDER — ONDANSETRON HCL 4 MG/2ML IJ SOLN
4.0000 mg | Freq: Once | INTRAMUSCULAR | Status: AC
  Administered 2015-09-25: 4 mg via INTRAVENOUS
  Filled 2015-09-25: qty 2

## 2015-09-25 MED ORDER — MORPHINE SULFATE (PF) 2 MG/ML IV SOLN
2.0000 mg | INTRAVENOUS | Status: DC | PRN
  Administered 2015-09-25: 3 mg via INTRAVENOUS
  Administered 2015-09-25: 2 mg via INTRAVENOUS
  Administered 2015-09-25: 3 mg via INTRAVENOUS
  Administered 2015-09-26: 2 mg via INTRAVENOUS
  Filled 2015-09-25: qty 2
  Filled 2015-09-25: qty 1
  Filled 2015-09-25: qty 2
  Filled 2015-09-25: qty 1

## 2015-09-25 MED ORDER — SODIUM CHLORIDE 0.9 % IV SOLN
8.0000 mg/h | INTRAVENOUS | Status: DC
  Administered 2015-09-25 – 2015-09-26 (×3): 8 mg/h via INTRAVENOUS
  Filled 2015-09-25 (×7): qty 80

## 2015-09-25 MED ORDER — SODIUM CHLORIDE 0.9 % IV SOLN
80.0000 mg | Freq: Once | INTRAVENOUS | Status: AC
  Administered 2015-09-25: 80 mg via INTRAVENOUS
  Filled 2015-09-25: qty 80

## 2015-09-25 MED ORDER — POLYETHYLENE GLYCOL 3350 17 G PO PACK
34.0000 g | PACK | Freq: Once | ORAL | Status: AC
  Administered 2015-09-25: 34 g via ORAL
  Filled 2015-09-25: qty 2

## 2015-09-25 MED ORDER — ALUM & MAG HYDROXIDE-SIMETH 200-200-20 MG/5ML PO SUSP
15.0000 mL | Freq: Once | ORAL | Status: AC
  Administered 2015-09-25: 15 mL via ORAL
  Filled 2015-09-25: qty 30

## 2015-09-25 MED ORDER — AMOXICILLIN 500 MG PO CAPS
1000.0000 mg | ORAL_CAPSULE | Freq: Once | ORAL | Status: AC
  Administered 2015-09-25: 1000 mg via ORAL
  Filled 2015-09-25: qty 2

## 2015-09-25 MED ORDER — LORAZEPAM 2 MG/ML IJ SOLN: 0.0000 mg | Freq: Two times a day (BID) | INTRAMUSCULAR | Status: DC

## 2015-09-25 MED ORDER — LORAZEPAM 2 MG/ML IJ SOLN: 0.0000 mg | Freq: Four times a day (QID) | INTRAMUSCULAR | Status: AC

## 2015-09-25 MED ORDER — BISACODYL 5 MG PO TBEC
10.0000 mg | DELAYED_RELEASE_TABLET | Freq: Every day | ORAL | Status: DC | PRN
  Administered 2015-09-25 – 2015-09-26 (×2): 10 mg via ORAL
  Filled 2015-09-25 (×2): qty 2

## 2015-09-25 MED ORDER — HEPARIN SODIUM (PORCINE) 5000 UNIT/ML IJ SOLN
5000.0000 [IU] | Freq: Three times a day (TID) | INTRAMUSCULAR | Status: DC
  Administered 2015-09-25 – 2015-09-27 (×7): 5000 [IU] via SUBCUTANEOUS
  Filled 2015-09-25 (×8): qty 1

## 2015-09-25 MED ORDER — VITAMIN B-1 100 MG PO TABS
100.0000 mg | ORAL_TABLET | Freq: Every day | ORAL | Status: DC
  Administered 2015-09-25 – 2015-09-28 (×4): 100 mg via ORAL
  Filled 2015-09-25 (×4): qty 1

## 2015-09-25 MED ORDER — CLARITHROMYCIN 500 MG PO TABS
500.0000 mg | ORAL_TABLET | Freq: Once | ORAL | Status: AC
  Administered 2015-09-25: 500 mg via ORAL
  Filled 2015-09-25: qty 1

## 2015-09-25 MED ORDER — ACETAMINOPHEN 325 MG PO TABS: 650.0000 mg | ORAL_TABLET | Freq: Four times a day (QID) | ORAL | Status: DC | PRN

## 2015-09-25 NOTE — H&P (Signed)
[facility] History and Physical  Louis Fox ITG:[id number] DOB: Sep 14, 1956 DOA: 09/24/2015  Referring physician: Dr.Floyd. PCP: Tula Nakayama  Specialists: None.  Chief Complaint: Abdominal pain.  HPI: Louis Fox is a 59 y.o. male with history of pseudogout presents to the ER because of abdominal pain. Patient has been having epigastric pain with nausea vomiting multiple episodes over the last 3 days. Pain is mostly in the epigastric area nonradiating. Has not had any bowel movements for last 4 days. CT abdomen and pelvis shows inflammation around the duodenal area. Labs show normal lipase levels. Patient denies any chest pain. Has had issues of some shortness of breath when the pain started 3 days ago but presently has no shortness of breath. Patient will be admitted for further management of possible duodenitis versus pancreatitis. Patient states he drinks liquor everyday. Has not had any previous episodes of epigastric pain. Patient was recently diagnosed with pseudogout during early part of this year for which patient has been placed on NSAIDs.  Review of Systems: As presented in the history of presenting illness, rest negative.  Past Medical History  Diagnosis Date  . Pseudogout    Past Surgical History  Procedure Laterality Date  . Umblical hernia     Social History:  reports that he has never smoked. He does not have any smokeless tobacco history on file. He reports that he drinks alcohol. He reports that he does not use illicit drugs. Where does patient live home. Can patient participate in ADLs? Yes.  Allergies  Allergen Reactions  . Shellfish Allergy Swelling    Family History:  Family History  Problem Relation Age of Onset  . Stroke Mother   . Hypertension Brother       Prior to Admission medications   Medication Sig Start Date End Date Taking? Authorizing Provider  acetaminophen (TYLENOL) 500 MG tablet Take 1,000 mg by mouth every 6 (six)  hours as needed for mild pain.   Yes Historical Provider, MD  anti-nausea (EMETROL) solution Take 15 mLs by mouth every 15 (fifteen) minutes as needed for nausea or vomiting.   Yes Historical Provider, MD  meloxicam (MOBIC) 15 MG tablet Take 15 mg by mouth daily.   Yes Historical Provider, MD  promethazine (PHENERGAN) 25 MG suppository Place 25 mg rectally every 6 (six) hours as needed for nausea or vomiting.   Yes Historical Provider, MD  ULTRAM 50 MG tablet Take 50 mg by mouth 2 (two) times daily. 08/20/15  Yes Historical Provider, MD    Physical Exam: Filed Vitals:   09/24/15 2230 09/24/15 2245 09/24/15 2300 09/25/15 0125  BP: 148/77 142/67 147/70 139/84  Pulse: 75 74 80 96  Temp:    98 F (36.7 C)  TempSrc:    Oral  Resp:    20  SpO2: 93% 94% 95% 97%     General:  Moderately built and nourished.  Eyes: Anicteric no pallor.  ENT: No discharge from the ears eyes nose and mouth.  Neck: No mass felt.  Cardiovascular: S1 and S2 heard.  Respiratory: No rhonchi or crepitations.  Abdomen: Soft mild epigastric tenderness no guarding or rigidity.  Skin: No rash.  Musculoskeletal: No edema.  Psychiatric: Appears normal.  Neurologic: Alert awake oriented to time place and person. Moves all extremities.  Labs on Admission:  Basic Metabolic Panel:  Recent Labs Lab 09/24/15 1454  NA 127*  K 4.0  CL 91*  CO2 26  GLUCOSE 135*  BUN 13  CREATININE 0.73  CALCIUM 9.3   Liver Function Tests:  Recent Labs Lab 09/24/15 1454  AST 22  ALT 10*  ALKPHOS 77  BILITOT 1.5*  PROT 6.5  ALBUMIN 3.7    Recent Labs Lab 09/24/15 1454  LIPASE 23   No results for input(s): AMMONIA in the last 168 hours. CBC:  Recent Labs Lab 09/24/15 1454  WBC 29.4*  HGB 14.2  HCT 40.3  MCV 89.6  PLT 364   Cardiac Enzymes: No results for input(s): CKTOTAL, CKMB, CKMBINDEX, TROPONINI in the last 168 hours.  BNP (last 3 results) No results for input(s): BNP in the last 8760  hours.  ProBNP (last 3 results) No results for input(s): PROBNP in the last 8760 hours.  CBG: No results for input(s): GLUCAP in the last 168 hours.  Radiological Exams on Admission: Ct Abdomen Pelvis W Contrast  09/25/2015   CLINICAL DATA:  Generalized abdominal pain, worse in the right and left lower quadrant. Nausea and vomiting. Symptoms for 3 days.  EXAM: CT ABDOMEN AND PELVIS WITH CONTRAST  TECHNIQUE: Multidetector CT imaging of the abdomen and pelvis was performed using the standard protocol following bolus administration of intravenous contrast.  CONTRAST:  157mL OMNIPAQUE IOHEXOL 300 MG/ML  SOLN  COMPARISON:  Abdominal ultrasound earlier this day.  FINDINGS: Lower chest: Multifocal linear atelectasis in both lower lobes. No pleural effusion.  Liver: Normal in size and density without focal lesion.  Hepatobiliary: Gallbladder physiologically distended, no calcified stone. No biliary dilatation.  Pancreas: Homogeneous enhancement. Extensive fat stranding and inflammatory change about the head, uncinate process, and proximal body. No ductal dilatation. Fat stranding extends to involve the lesser sac and proximal mesentery. There is adjacent tracking free fluid, however it no pancreatic pseudocyst. Splenic vein remains patent.  Spleen: Normal.  Adrenal glands: No nodule.  Kidneys: Symmetric renal enhancement. No hydronephrosis. There is an heterogeneous 2 cm lesion in the anterior mid lower right kidney corresponding to the hyperechoic lesion on ultrasound. Tiny subcentimeter cyst in the lower left kidney.  Stomach/Bowel: Stomach is decompressed, mild wall thickening is seen involving the gastric body. There is thickening of the included distal esophagus. Diffuse wall thickening of the entire duodenum. Adjacent soft tissue stranding involving the proximal mesentery and lesser sac with small amount tracking fluid. No pneumoperitoneum or free air. The more distal small bowel loops are decompressed.  Small volume of stool in the proximal colon. Equivocal wall thickening of the ascending colon, likely reactive. The distal colon is decompressed. The appendix is prominent in size proximally measuring 8-9 mm, however normal in caliber distally in there is no surrounding inflammatory change.  Vascular/Lymphatic: No retroperitoneal adenopathy. Abdominal aorta is normal in caliber. Mild atherosclerosis of the abdominal aorta without aneurysm.  Reproductive: Prostate gland is enlarged measuring 6.0 x 4.1 cm.  Bladder: Physiologically distended, no wall thickening.  Other: Small amount of free fluid in the pelvis, likely tracking from the upper abdominal inflammation. No abscess or loculated fluid collection.  Musculoskeletal: There are no acute or suspicious osseous abnormalities. Chondrocalcinosis incidentally noted about the pubic symphysis. There is degenerative disc disease at L5-S1.  IMPRESSION: 1. Marked inflammation with soft tissue stranding and free fluid in the upper abdomen involving the retroperitoneum and lesser sac. There is diffuse duodenum wall thickening. Findings likely reflect duodenitis versus less likely pancreatitis given the normal pancreatic enzymes. No perforation. 2. Heterogeneous 2 cm lesion in the right kidney corresponding to that seen on ultrasound. Findings are concerning for solid renal mass with likely enhancement,  however enhancement characteristics are difficult to assess on the current exam. Recommend renal protocol MRI after acute symptoms have resolved. 3. Enlarged prostate gland.   Electronically Signed   By: Jeb Levering M.D.   On: 09/25/2015 00:04   Dg Abd Acute W/chest  09/24/2015   CLINICAL DATA:  Nausea and vomiting for 3 days.  Constipation.  EXAM: DG ABDOMEN ACUTE W/ 1V CHEST  COMPARISON:  None.  FINDINGS: PA chest: There is slight atelectasis in the left base. Lungs elsewhere clear. Heart size and pulmonary vascularity are normal. No adenopathy.  Supine and upright  abdomen: There is fairly diffuse stool throughout colon. Colon does not appear appreciably distended with stool, however. There is no bowel dilatation or air-fluid level suggesting obstruction. No free air. There are phleboliths in the pelvis.  IMPRESSION: Diffuse stool throughout the colon without colonic distention from stool. No obstruction or free air. Mild left base atelectasis. No frank edema or consolidation.   Electronically Signed   By: Lowella Grip III M.D.   On: 09/24/2015 17:39   US Abdomen Limited Ruq  09/24/2015   CLINICAL DATA:  Abdomen pain /cramping for 2 days  EXAM: US ABDOMEN LIMITED - RIGHT UPPER QUADRANT  COMPARISON:  None.  FINDINGS: Gallbladder:  No gallstones or wall thickening visualized. No sonographic Murphy sign noted.  Common bile duct:  Diameter: 3 mm  Liver:  No focal lesion identified. Within normal limits in parenchymal echogenicity.  There is incidental finding of a slight hyperechoic lesion in the right kidney measuring 1.6 x 1.7 x 2 cm.  IMPRESSION: No acute abnormality.  Incidental finding of a slight hyperechoic 2 cm mass in the right kidney. Recommend further evaluation with three-phase kidney CT on outpatient basis.   Electronically Signed   By: Abelardo Diesel M.D.   On: 09/24/2015 20:53    EKG: Independently reviewed. Normal sinus rhythm.  Assessment/Plan Principal Problem:   Abdominal pain Active Problems:   Duodenitis   Leucocytosis   Right renal mass   1. Abdominal pain differential includes duodenitis versus pancreatitis - at this time I have Patient nothing by mouth and on Protonix infusion. We will recheck lipase and LFTs. Consult GI name for possible EGD. May have to repeat KUB name if pain persists. Morphine when necessary for pain. Troponin is pending. EKG was unremarkable. 2. Leukocytosis probably reactionary - patient is presently afebrile. Closely follow CBC. 3. Right renal mass - will need further workup as outpatient. 4. History of  pseudogout - hold NSAIDs due to #1 as duodenitis is in the differential.  Patient states he drinks alcohol everyday for which I have placed patient on CIWA protocol.  I have personally reviewed the patient's chest x-ray and EKG.   DVT Prophylaxis SCDs in anticipation of procedure.  Code Status: Full code.  Family Communication: Discussed with patient's wife. Disposition Plan: Admit to inpatient.    KAKRAKANDY,ARSHAD N. [facility] Pager [phone].  If 7PM-7AM, please contact night-coverage [email or web address] Password [email or web address] 09/25/2015, 2:16 AM

## 2015-09-25 NOTE — Progress Notes (Signed)
Admission Note  Patient arrived to floor in room [id number] via bed from ED. Patient alert and oriented X 4. Vitals signs  Oral temperature 98 F       Blood pressure 139/84       Pulse 96       RR 20       SpO2 97 % on room air. Complains of pain 3/10 over abdomen, refused pain medication at the moment. Skin intact, no pressure ulcer noted in sacral area.  Patient's ID armband verified with patient/ family, and in place. Information packet given to patient/ family. Fall risk assessed, SR up X2, patient/ family able to verbalize understanding of risks associated with falls and to call nurse or staff to assist before getting out of bed. Patient/ family oriented to room and equipment. Call bell within reach.

## 2015-09-25 NOTE — Care Management Note (Signed)
Case Management Note  Patient Details  Name: Louis Fox MRN: [id number] Date of Birth: 29-Jul-1956  Subjective/Objective:                  Date:09-25-15 Wednesday Spoke with patient at the bedside.. Introduced self as case Freight forwarder and explained role in discharge planning and how to be reached. Verified patient lives in [place], [place], [address] , with spouse. Verified patient anticipates to go home with family sister in law Laakea Pereira [phone], wife in [place] until Friday,  at time of discharge and will have  part-time supervision by familyat this time to best of their knowledge. Patient has DME  walker that he doesn't use at this time. Expressed potential need for no other DME. Patient denied  needing help with their medication. Patient drives to MD appointments. Verified patient has PCP Dr Karin Lieu at [facility]. .   Plan: CM will continue to follow for discharge planning and [facility] resources.   Carles Collet RN BSN CM [phone]   Action/Plan:   Expected Discharge Date:  09/27/15               Expected Discharge Plan:  Home/Self Care  In-House Referral:     Discharge planning Services  CM Consult  Post Acute Care Choice:    Choice offered to:     DME Arranged:    DME Agency:     HH Arranged:    HH Agency:     Status of Service:  In process, will continue to follow  Medicare Important Message Given:    Date Medicare IM Given:    Medicare IM give by:    Date Additional Medicare IM Given:    Additional Medicare Important Message give by:     If discussed at [place] of Stay Meetings, dates discussed:    Additional Comments:  Carles Collet, RN 09/25/2015, 10:56 AM

## 2015-09-25 NOTE — Progress Notes (Signed)
[facility] admission called for admitting MD to see patient at bedside

## 2015-09-25 NOTE — Progress Notes (Signed)
Received report from ED nurse, Geni Bers. Patient is to received his scheduled medications before transferred to 5W.

## 2015-09-25 NOTE — Progress Notes (Signed)
Patient Demographics  Louis Fox, is a 59 y.o. male, DOB - 1956-02-02, NWG:[id number]  Admit date - 09/24/2015   Admitting Physician Rise Patience, MD  Outpatient Primary MD for the patient is KAPLAN,KRISTEN, PA-C  LOS - 0   Chief Complaint  Patient presents with  . Abdominal Pain         Subjective:   [name] today has, No headache, No chest pain, - No Nausea,No Cough - SOB. Complaints of abdominal pain, denies nausea, vomiting or diarrhea.  Assessment & Plan    Principal Problem:   Abdominal pain Active Problems:   Duodenitis   Leucocytosis   Right renal mass  Abdominal pain secondary to duodenitis - CT abdomen pelvis with evidence of duodenitis versus pancreatitis, but lipase and LFTs within normal limits. - Started on IV Cipro and Flagyl, white count trending down, afebrile. - Started on clear liquid diet, continue with IV fluids, when necessary pain and nausea medication   Borderline troponin - Continue to trend, this is most likely related to demand ischemia from infectious process - Monitor on telemetry  Leukocytosis - Trending down, continue to monitor - Negative urinalysis, no acute finding in chest x-[name].  Right renal mass - Will need to follow with urology as an outpatient  History of pseudogout - Hold NSAIDs for duodenitis  Alcohol abuse - Continue with CIWA protocol  Code Status: Full  Family Communication: none  Disposition Plan: Pending further workup   Procedures  None   Consults  None   Medications  Scheduled Meds: . ciprofloxacin  400 mg Intravenous Q12H  . folic acid  1 mg Oral Daily  . LORazepam  0-4 mg Intravenous Q6H   Followed by  . [START ON 09/27/2015] LORazepam  0-4 mg Intravenous Q12H  . metronidazole  500 mg Intravenous Q8H  . multivitamin with minerals  1 tablet Oral Daily  . [START ON 09/28/2015] pantoprazole  (PROTONIX) IV  40 mg Intravenous Q12H  . potassium chloride  40 mEq Oral Q4H  . thiamine  100 mg Oral Daily   Or  . thiamine  100 mg Intravenous Daily   Continuous Infusions: . sodium chloride 125 mL/hr at 09/25/15 0854  . pantoprozole (PROTONIX) infusion 8 mg/hr (09/25/15 0229)   PRN Meds:.acetaminophen **OR** acetaminophen, bisacodyl, LORazepam **OR** LORazepam, morphine injection, ondansetron **OR** ondansetron (ZOFRAN) IV  DVT Prophylaxis - Heparin -   Lab Results  Component Value Date   PLT 251 09/25/2015    Antibiotics    Anti-infectives    Start     Dose/Rate Route Frequency Ordered Stop   09/25/15 0800  ciprofloxacin (CIPRO) IVPB 400 mg     400 mg 200 mL/hr over 60 Minutes Intravenous Every 12 hours 09/25/15 0733     09/25/15 0800  metroNIDAZOLE (FLAGYL) IVPB 500 mg     500 mg 100 mL/hr over 60 Minutes Intravenous Every 8 hours 09/25/15 0733     09/25/15 0030  amoxicillin (AMOXIL) capsule 1,000 mg     1,000 mg Oral  Once 09/25/15 0025 09/25/15 0102   09/25/15 0030  clarithromycin (BIAXIN) tablet 500 mg     500 mg Oral  Once 09/25/15 0025 09/25/15 0102  Objective:   Filed Vitals:   09/25/15 0125 09/25/15 0241 09/25/15 0631 09/25/15 1232  BP: 139/84  112/79 140/78  Pulse: 96  78 75  Temp: 98 F (36.7 C)  98.9 F (37.2 C)   TempSrc: Oral  Oral   Resp: 20  18 18   Height:  5\' 7"  (1.702 m)    Weight:  76.204 kg (168 lb)    SpO2: 97%  96% 98%    Wt Readings from Last 3 Encounters:  09/25/15 76.204 kg (168 lb)     Intake/Output Summary (Last 24 hours) at 09/25/15 1248 Last data filed at 09/25/15 1215  Gross per 24 hour  Intake 1738.33 ml  Output      0 ml  Net 1738.33 ml     Physical Exam  Awake Alert, Oriented X 3,  [place].AT,PERRAL Supple Neck,No JVD, No cervical lymphadenopathy appriciated.  Symmetrical Chest wall movement, Good air movement bilaterally,  RRR,No Gallops,Rubs or new Murmurs, No Parasternal Heave +ve B.Sounds, Abd Soft,  mild epigastric tenderness, No organomegaly appriciated, No rebound - guarding or rigidity. No Cyanosis, Clubbing or edema, No new Rash or bruise     Data Review   Micro Results No results found for this or any previous visit (from the past 240 hour(s)).  Radiology Reports Ct Abdomen Pelvis W Contrast  09/25/2015   CLINICAL DATA:  Generalized abdominal pain, worse in the right and left lower quadrant. Nausea and vomiting. Symptoms for 3 days.  EXAM: CT ABDOMEN AND PELVIS WITH CONTRAST  TECHNIQUE: Multidetector CT imaging of the abdomen and pelvis was performed using the standard protocol following bolus administration of intravenous contrast.  CONTRAST:  150mL OMNIPAQUE IOHEXOL 300 MG/ML  SOLN  COMPARISON:  Abdominal ultrasound earlier this day.  FINDINGS: Lower chest: Multifocal linear atelectasis in both lower lobes. No pleural effusion.  Liver: Normal in size and density without focal lesion.  Hepatobiliary: Gallbladder physiologically distended, no calcified stone. No biliary dilatation.  Pancreas: Homogeneous enhancement. Extensive fat stranding and inflammatory change about the head, uncinate process, and proximal body. No ductal dilatation. Fat stranding extends to involve the lesser sac and proximal mesentery. There is adjacent tracking free fluid, however it no pancreatic pseudocyst. Splenic vein remains patent.  Spleen: Normal.  Adrenal glands: No nodule.  Kidneys: Symmetric renal enhancement. No hydronephrosis. There is an heterogeneous 2 cm lesion in the anterior mid lower right kidney corresponding to the hyperechoic lesion on ultrasound. Tiny subcentimeter cyst in the lower left kidney.  Stomach/Bowel: Stomach is decompressed, mild wall thickening is seen involving the gastric body. There is thickening of the included distal esophagus. Diffuse wall thickening of the entire duodenum. Adjacent soft tissue stranding involving the proximal mesentery and lesser sac with small amount tracking  fluid. No pneumoperitoneum or free air. The more distal small bowel loops are decompressed. Small volume of stool in the proximal colon. Equivocal wall thickening of the ascending colon, likely reactive. The distal colon is decompressed. The appendix is prominent in size proximally measuring 8-9 mm, however normal in caliber distally in there is no surrounding inflammatory change.  Vascular/Lymphatic: No retroperitoneal adenopathy. Abdominal aorta is normal in caliber. Mild atherosclerosis of the abdominal aorta without aneurysm.  Reproductive: Prostate gland is enlarged measuring 6.0 x 4.1 cm.  Bladder: Physiologically distended, no wall thickening.  Other: Small amount of free fluid in the pelvis, likely tracking from the upper abdominal inflammation. No abscess or loculated fluid collection.  Musculoskeletal: There are no acute or suspicious osseous abnormalities.  Chondrocalcinosis incidentally noted about the pubic symphysis. There is degenerative disc disease at L5-S1.  IMPRESSION: 1. Marked inflammation with soft tissue stranding and free fluid in the upper abdomen involving the retroperitoneum and lesser sac. There is diffuse duodenum wall thickening. Findings likely reflect duodenitis versus less likely pancreatitis given the normal pancreatic enzymes. No perforation. 2. Heterogeneous 2 cm lesion in the right kidney corresponding to that seen on ultrasound. Findings are concerning for solid renal mass with likely enhancement, however enhancement characteristics are difficult to assess on the current exam. Recommend renal protocol MRI after acute symptoms have resolved. 3. Enlarged prostate gland.   Electronically Signed   By: Jeb Levering M.D.   On: 09/25/2015 00:04   Dg Abd Acute W/chest  09/24/2015   CLINICAL DATA:  Nausea and vomiting for 3 days.  Constipation.  EXAM: DG ABDOMEN ACUTE W/ 1V CHEST  COMPARISON:  None.  FINDINGS: PA chest: There is slight atelectasis in the left base. Lungs elsewhere  clear. Heart size and pulmonary vascularity are normal. No adenopathy.  Supine and upright abdomen: There is fairly diffuse stool throughout colon. Colon does not appear appreciably distended with stool, however. There is no bowel dilatation or air-fluid level suggesting obstruction. No free air. There are phleboliths in the pelvis.  IMPRESSION: Diffuse stool throughout the colon without colonic distention from stool. No obstruction or free air. Mild left base atelectasis. No frank edema or consolidation.   Electronically Signed   By: Lowella Grip III M.D.   On: 09/24/2015 17:39   US Abdomen Limited Ruq  09/24/2015   CLINICAL DATA:  Abdomen pain /cramping for 2 days  EXAM: US ABDOMEN LIMITED - RIGHT UPPER QUADRANT  COMPARISON:  None.  FINDINGS: Gallbladder:  No gallstones or wall thickening visualized. No sonographic Murphy sign noted.  Common bile duct:  Diameter: 3 mm  Liver:  No focal lesion identified. Within normal limits in parenchymal echogenicity.  There is incidental finding of a slight hyperechoic lesion in the right kidney measuring 1.6 x 1.7 x 2 cm.  IMPRESSION: No acute abnormality.  Incidental finding of a slight hyperechoic 2 cm mass in the right kidney. Recommend further evaluation with three-phase kidney CT on outpatient basis.   Electronically Signed   By: Abelardo Diesel M.D.   On: 09/24/2015 20:53     CBC  Recent Labs Lab 09/24/15 1454 09/25/15 0315  WBC 29.4* 23.0*  HGB 14.2 12.9*  HCT 40.3 37.0*  PLT 364 251  MCV 89.6 90.9  MCH 31.6 31.7  MCHC 35.2 34.9  RDW 12.5 12.5  LYMPHSABS  --  1.1  MONOABS  --  1.7*  EOSABS  --  0.0  BASOSABS  --  0.0    Chemistries   Recent Labs Lab 09/24/15 1454 09/25/15 0315  NA 127*  --   K 4.0  --   CL 91*  --   CO2 26  --   GLUCOSE 135*  --   BUN 13  --   CREATININE 0.73  --   CALCIUM 9.3  --   AST 22 17  ALT 10* 8*  ALKPHOS 77 67  BILITOT 1.5* 1.2    ------------------------------------------------------------------------------------------------------------------ estimated creatinine clearance is 91.8 mL/min (by C-G formula based on Cr of 0.73). ------------------------------------------------------------------------------------------------------------------ No results for input(s): HGBA1C in the last 72 hours. ------------------------------------------------------------------------------------------------------------------ No results for input(s): CHOL, HDL, LDLCALC, TRIG, CHOLHDL, LDLDIRECT in the last 72 hours. ------------------------------------------------------------------------------------------------------------------ No results for input(s): TSH, T4TOTAL, T3FREE, THYROIDAB in the last  72 hours.  Invalid input(s): FREET3 ------------------------------------------------------------------------------------------------------------------ No results for input(s): VITAMINB12, FOLATE, FERRITIN, TIBC, IRON, RETICCTPCT in the last 72 hours.  Coagulation profile No results for input(s): INR, PROTIME in the last 168 hours.  No results for input(s): DDIMER in the last 72 hours.  Cardiac Enzymes  Recent Labs Lab 09/25/15 0315  TROPONINI 0.06*   ------------------------------------------------------------------------------------------------------------------ Invalid input(s): POCBNP     Time Spent in minutes   30 minutes   ELGERGAWY, DAWOOD M.D on 09/25/2015 at 12:48 PM  Between 7am to 7pm - Pager - [phone]  After 7pm go to [email or web address] - password [facility]  [facility]   Office  [phone]

## 2015-09-26 DIAGNOSIS — N2889 Other specified disorders of kidney and ureter: Secondary | ICD-10-CM | POA: Diagnosis not present

## 2015-09-26 DIAGNOSIS — K2980 Duodenitis without bleeding: Secondary | ICD-10-CM

## 2015-09-26 DIAGNOSIS — D72829 Elevated white blood cell count, unspecified: Secondary | ICD-10-CM | POA: Diagnosis not present

## 2015-09-26 DIAGNOSIS — R1013 Epigastric pain: Secondary | ICD-10-CM | POA: Diagnosis not present

## 2015-09-26 LAB — COMPREHENSIVE METABOLIC PANEL
ALT: 9 U/L — ABNORMAL LOW (ref 17–63)
AST: 18 U/L (ref 15–41)
Albumin: 2.8 g/dL — ABNORMAL LOW (ref 3.5–5.0)
Alkaline Phosphatase: 86 U/L (ref 38–126)
Anion gap: 7 (ref 5–15)
BUN: 8 mg/dL (ref 6–20)
CO2: 24 mmol/L (ref 22–32)
Calcium: 8.5 mg/dL — ABNORMAL LOW (ref 8.9–10.3)
Chloride: 94 mmol/L — ABNORMAL LOW (ref 101–111)
Creatinine, Ser: 0.61 mg/dL (ref 0.61–1.24)
GFR calc Af Amer: >60 mL/min (ref >60)
GFR calc non Af Amer: >60 mL/min (ref >60)
Glucose, Bld: 120 mg/dL — ABNORMAL HIGH (ref 65–99)
Potassium: 4.3 mmol/L (ref 3.5–5.1)
Sodium: 125 mmol/L — ABNORMAL LOW (ref 135–145)
Total Bilirubin: 1.1 mg/dL (ref 0.3–1.2)
Total Protein: 5.6 g/dL — ABNORMAL LOW (ref 6.5–8.1)

## 2015-09-26 LAB — CBC
HCT: 33.4 % — ABNORMAL LOW (ref 39.0–52.0)
Hemoglobin: 11.5 g/dL — ABNORMAL LOW (ref 13.0–17.0)
MCH: 31.5 pg (ref 26.0–34.0)
MCHC: 34.4 g/dL (ref 30.0–36.0)
MCV: 91.5 fL (ref 78.0–100.0)
Platelets: 263 10*3/uL (ref 150–400)
RBC: 3.65 MIL/uL — ABNORMAL LOW (ref 4.22–5.81)
RDW: 12.5 % (ref 11.5–15.5)
WBC: 18.2 10*3/uL — ABNORMAL HIGH (ref 4.0–10.5)

## 2015-09-26 LAB — TROPONIN I: Troponin I: 0.05 ng/mL — ABNORMAL HIGH (ref <0.031)

## 2015-09-26 LAB — H. PYLORI ANTIBODY, IGG: H Pylori IgG: <0.9 U/mL (ref 0.0–0.8)

## 2015-09-26 LAB — GLUCOSE, CAPILLARY
Glucose-Capillary: 113 mg/dL — ABNORMAL HIGH (ref 65–99)
Glucose-Capillary: 112 mg/dL — ABNORMAL HIGH (ref 65–99)
Glucose-Capillary: 122 mg/dL — ABNORMAL HIGH (ref 65–99)
Glucose-Capillary: 96 mg/dL (ref 65–99)

## 2015-09-26 LAB — OSMOLALITY: Osmolality: 261 mOsm/kg — ABNORMAL LOW (ref 275–300)

## 2015-09-26 LAB — OSMOLALITY, URINE: Osmolality, Ur: 698 mOsm/kg (ref 390–1090)

## 2015-09-26 LAB — LIPASE, BLOOD: Lipase: 13 U/L — ABNORMAL LOW (ref 22–51)

## 2015-09-26 MED ORDER — SODIUM CHLORIDE 1 G PO TABS
2.0000 g | ORAL_TABLET | Freq: Once | ORAL | Status: AC
  Administered 2015-09-26: 2 g via ORAL
  Filled 2015-09-26: qty 2

## 2015-09-26 MED ORDER — FUROSEMIDE 10 MG/ML IJ SOLN
20.0000 mg | Freq: Once | INTRAMUSCULAR | Status: AC
  Administered 2015-09-26: 20 mg via INTRAVENOUS
  Filled 2015-09-26: qty 2

## 2015-09-26 MED ORDER — PANTOPRAZOLE SODIUM 40 MG PO TBEC
40.0000 mg | DELAYED_RELEASE_TABLET | Freq: Every day | ORAL | Status: DC
  Administered 2015-09-27 – 2015-09-28 (×2): 40 mg via ORAL
  Filled 2015-09-26 (×2): qty 1

## 2015-09-26 MED ORDER — SODIUM CHLORIDE 0.9 % IV SOLN
INTRAVENOUS | Status: DC
  Administered 2015-09-26: 08:00:00 via INTRAVENOUS

## 2015-09-26 NOTE — Progress Notes (Signed)
Patient Demographics  Louis Fox, is a 59 y.o. male, DOB - Sep 18, 1956, QVZ:[id number]  Admit date - 09/24/2015   Admitting Physician Rise Patience, MD  Outpatient Primary MD for the patient is Tula Nakayama  LOS - 1   Chief Complaint  Patient presents with  . Abdominal Pain         Subjective:   Louis Fox today has, No headache, No chest pain, - No Nausea,No Cough - SOB. Reports abdominal pain significantly subsided, denies nausea, vomiting or diarrhea.  Assessment & Plan    Principal Problem:   Abdominal pain Active Problems:   Duodenitis   Leucocytosis   Right renal mass  Abdominal pain secondary to duodenitis - CT abdomen pelvis with evidence of duodenitis versus pancreatitis, but lipase and LFTs within normal limits. - Continue with  IV Cipro and Flagyl,  remains afebrile, leukocytosis improving - Tolerating clear liquid diet, will advance to full liquid.  Hyponatremia - Patient with low serum osmolality, and urine osmolality of 698, picture compatible with SIADH, will DC IV fluids giving improved oral intake, will place on fluid restriction.   Borderline troponin - this is most likely related to demand ischemia from infectious process - Monitor on telemetry, no significant arrhythmias  Leukocytosis - Trending down, continue to monitor - Negative urinalysis, no acute finding in chest x-ray.  Right renal mass - Will need to follow with urology as an outpatient  History of pseudogout - Hold NSAIDs for duodenitis  Alcohol abuse - Continue with CIWA protocol  Code Status: Full  Family Communication: none  Disposition Plan: Pending further workup   Procedures  None   Consults  None   Medications  Scheduled Meds: . ciprofloxacin  400 mg Intravenous Q12H  . folic acid  1 mg Oral Daily  . furosemide  20 mg Intravenous Once  . heparin  subcutaneous  5,000 Units Subcutaneous 3 times per day  . LORazepam  0-4 mg Intravenous Q6H   Followed by  . [START ON 09/27/2015] LORazepam  0-4 mg Intravenous Q12H  . metronidazole  500 mg Intravenous Q8H  . multivitamin with minerals  1 tablet Oral Daily  . [START ON 09/27/2015] pantoprazole  40 mg Oral Daily  . thiamine  100 mg Oral Daily   Or  . thiamine  100 mg Intravenous Daily   Continuous Infusions:   PRN Meds:.acetaminophen **OR** acetaminophen, bisacodyl, LORazepam **OR** LORazepam, morphine injection, ondansetron **OR** ondansetron (ZOFRAN) IV  DVT Prophylaxis - Heparin -   Lab Results  Component Value Date   PLT 263 09/26/2015    Antibiotics    Anti-infectives    Start     Dose/Rate Route Frequency Ordered Stop   09/25/15 0800  ciprofloxacin (CIPRO) IVPB 400 mg     400 mg 200 mL/hr over 60 Minutes Intravenous Every 12 hours 09/25/15 0733     09/25/15 0800  metroNIDAZOLE (FLAGYL) IVPB 500 mg     500 mg 100 mL/hr over 60 Minutes Intravenous Every 8 hours 09/25/15 0733     09/25/15 0030  amoxicillin (AMOXIL) capsule 1,000 mg     1,000 mg Oral  Once 09/25/15 0025 09/25/15 0102   09/25/15 0030  clarithromycin (BIAXIN) tablet 500 mg  500 mg Oral  Once 09/25/15 0025 09/25/15 0102          Objective:   Filed Vitals:   09/25/15 1232 09/26/15 0007 09/26/15 0547 09/26/15 1145  BP: 140/78 139/71 137/72 154/82  Pulse: 75 69 67 68  Temp:  98.1 F (36.7 C) 98 F (36.7 C) 97.7 F (36.5 C)  TempSrc:  Oral Oral Oral  Resp: 18 18 18 17   Height:      Weight:      SpO2: 98% 98% 98% 99%    Wt Readings from Last 3 Encounters:  09/25/15 76.204 kg (168 lb)     Intake/Output Summary (Last 24 hours) at 09/26/15 1334 Last data filed at 09/26/15 1045  Gross per 24 hour  Intake 1849.17 ml  Output    200 ml  Net 1649.17 ml     Physical Exam  Awake Alert, Oriented X 3,  [place].AT,PERRAL Supple Neck,No JVD, No cervical lymphadenopathy appriciated.  Symmetrical  Chest wall movement, Good air movement bilaterally,  RRR,No Gallops,Rubs or new Murmurs, No Parasternal Heave +ve B.Sounds, Abd Soft, epigastric tenderness significantly improved, No organomegaly appriciated, No rebound - guarding or rigidity. No Cyanosis, Clubbing or edema, No new Rash or bruise     Data Review   Micro Results No results found for this or any previous visit (from the past 240 hour(s)).  Radiology Reports Ct Abdomen Pelvis W Contrast  09/25/2015   CLINICAL DATA:  Generalized abdominal pain, worse in the right and left lower quadrant. Nausea and vomiting. Symptoms for 3 days.  EXAM: CT ABDOMEN AND PELVIS WITH CONTRAST  TECHNIQUE: Multidetector CT imaging of the abdomen and pelvis was performed using the standard protocol following bolus administration of intravenous contrast.  CONTRAST:  141mL OMNIPAQUE IOHEXOL 300 MG/ML  SOLN  COMPARISON:  Abdominal ultrasound earlier this day.  FINDINGS: Lower chest: Multifocal linear atelectasis in both lower lobes. No pleural effusion.  Liver: Normal in size and density without focal lesion.  Hepatobiliary: Gallbladder physiologically distended, no calcified stone. No biliary dilatation.  Pancreas: Homogeneous enhancement. Extensive fat stranding and inflammatory change about the head, uncinate process, and proximal body. No ductal dilatation. Fat stranding extends to involve the lesser sac and proximal mesentery. There is adjacent tracking free fluid, however it no pancreatic pseudocyst. Splenic vein remains patent.  Spleen: Normal.  Adrenal glands: No nodule.  Kidneys: Symmetric renal enhancement. No hydronephrosis. There is an heterogeneous 2 cm lesion in the anterior mid lower right kidney corresponding to the hyperechoic lesion on ultrasound. Tiny subcentimeter cyst in the lower left kidney.  Stomach/Bowel: Stomach is decompressed, mild wall thickening is seen involving the gastric body. There is thickening of the included distal esophagus.  Diffuse wall thickening of the entire duodenum. Adjacent soft tissue stranding involving the proximal mesentery and lesser sac with small amount tracking fluid. No pneumoperitoneum or free air. The more distal small bowel loops are decompressed. Small volume of stool in the proximal colon. Equivocal wall thickening of the ascending colon, likely reactive. The distal colon is decompressed. The appendix is prominent in size proximally measuring 8-9 mm, however normal in caliber distally in there is no surrounding inflammatory change.  Vascular/Lymphatic: No retroperitoneal adenopathy. Abdominal aorta is normal in caliber. Mild atherosclerosis of the abdominal aorta without aneurysm.  Reproductive: Prostate gland is enlarged measuring 6.0 x 4.1 cm.  Bladder: Physiologically distended, no wall thickening.  Other: Small amount of free fluid in the pelvis, likely tracking from the upper abdominal inflammation. No abscess  or loculated fluid collection.  Musculoskeletal: There are no acute or suspicious osseous abnormalities. Chondrocalcinosis incidentally noted about the pubic symphysis. There is degenerative disc disease at L5-S1.  IMPRESSION: 1. Marked inflammation with soft tissue stranding and free fluid in the upper abdomen involving the retroperitoneum and lesser sac. There is diffuse duodenum wall thickening. Findings likely reflect duodenitis versus less likely pancreatitis given the normal pancreatic enzymes. No perforation. 2. Heterogeneous 2 cm lesion in the right kidney corresponding to that seen on ultrasound. Findings are concerning for solid renal mass with likely enhancement, however enhancement characteristics are difficult to assess on the current exam. Recommend renal protocol MRI after acute symptoms have resolved. 3. Enlarged prostate gland.   Electronically Signed   By: Jeb Levering M.D.   On: 09/25/2015 00:04   Dg Abd Acute W/chest  09/24/2015   CLINICAL DATA:  Nausea and vomiting for 3 days.   Constipation.  EXAM: DG ABDOMEN ACUTE W/ 1V CHEST  COMPARISON:  None.  FINDINGS: PA chest: There is slight atelectasis in the left base. Lungs elsewhere clear. Heart size and pulmonary vascularity are normal. No adenopathy.  Supine and upright abdomen: There is fairly diffuse stool throughout colon. Colon does not appear appreciably distended with stool, however. There is no bowel dilatation or air-fluid level suggesting obstruction. No free air. There are phleboliths in the pelvis.  IMPRESSION: Diffuse stool throughout the colon without colonic distention from stool. No obstruction or free air. Mild left base atelectasis. No frank edema or consolidation.   Electronically Signed   By: Lowella Grip III M.D.   On: 09/24/2015 17:39   US Abdomen Limited Ruq  09/24/2015   CLINICAL DATA:  Abdomen pain /cramping for 2 days  EXAM: US ABDOMEN LIMITED - RIGHT UPPER QUADRANT  COMPARISON:  None.  FINDINGS: Gallbladder:  No gallstones or wall thickening visualized. No sonographic Murphy sign noted.  Common bile duct:  Diameter: 3 mm  Liver:  No focal lesion identified. Within normal limits in parenchymal echogenicity.  There is incidental finding of a slight hyperechoic lesion in the right kidney measuring 1.6 x 1.7 x 2 cm.  IMPRESSION: No acute abnormality.  Incidental finding of a slight hyperechoic 2 cm mass in the right kidney. Recommend further evaluation with three-phase kidney CT on outpatient basis.   Electronically Signed   By: Abelardo Diesel M.D.   On: 09/24/2015 20:53     CBC  Recent Labs Lab 09/24/15 1454 09/25/15 0315 09/26/15  WBC 29.4* 23.0* 18.2*  HGB 14.2 12.9* 11.5*  HCT 40.3 37.0* 33.4*  PLT 364 251 263  MCV 89.6 90.9 91.5  MCH 31.6 31.7 31.5  MCHC 35.2 34.9 34.4  RDW 12.5 12.5 12.5  LYMPHSABS  --  1.1  --   MONOABS  --  1.7*  --   EOSABS  --  0.0  --   BASOSABS  --  0.0  --     Chemistries   Recent Labs Lab 09/24/15 1454 09/25/15 0315 09/26/15  NA 127*  --  125*  K 4.0  --   4.3  CL 91*  --  94*  CO2 26  --  24  GLUCOSE 135*  --  120*  BUN 13  --  8  CREATININE 0.73  --  0.61  CALCIUM 9.3  --  8.5*  AST 22 17 18   ALT 10* 8* 9*  ALKPHOS 77 67 86  BILITOT 1.5* 1.2 1.1   ------------------------------------------------------------------------------------------------------------------ estimated creatinine clearance is 91.8  mL/min (by C-G formula based on Cr of 0.61). ------------------------------------------------------------------------------------------------------------------ No results for input(s): HGBA1C in the last 72 hours. ------------------------------------------------------------------------------------------------------------------ No results for input(s): CHOL, HDL, LDLCALC, TRIG, CHOLHDL, LDLDIRECT in the last 72 hours. ------------------------------------------------------------------------------------------------------------------ No results for input(s): TSH, T4TOTAL, T3FREE, THYROIDAB in the last 72 hours.  Invalid input(s): FREET3 ------------------------------------------------------------------------------------------------------------------ No results for input(s): VITAMINB12, FOLATE, FERRITIN, TIBC, IRON, RETICCTPCT in the last 72 hours.  Coagulation profile No results for input(s): INR, PROTIME in the last 168 hours.  No results for input(s): DDIMER in the last 72 hours.  Cardiac Enzymes  Recent Labs Lab 09/25/15 1317 09/25/15 1813 09/26/15  TROPONINI <0.03 0.10* 0.05*   ------------------------------------------------------------------------------------------------------------------ Invalid input(s): POCBNP     Time Spent in minutes   30 minutes   ELGERGAWY, DAWOOD M.D on 09/26/2015 at 1:34 PM  Between 7am to 7pm - Pager - [phone]  After 7pm go to [email or web address] - password [email or web address]  [facility]   Office  [phone]

## 2015-09-26 NOTE — Clinical Documentation Improvement (Signed)
Internal Medicine  Based on the clinical findings below, please document any associated diagnoses/conditions the patient has or may have.   Hyponatremia  Other  Clinically Undetermined  Supporting Information: Component     Latest Ref Rng 09/24/2015 09/25/2015 09/26/2015  Sodium     135 - 145 mmol/L 127 (L)  125 (L)  Chloride     101 - 111 mmol/L 91 (L)  94 (L)  NS 0.9% bolus 2,000 ml then NS @ 1104ml/hr Dehydration C/O Has had persistent vomiting since this pain started Gastrointestinal: Positive for nausea, vomiting and constipation Please exercise your independent, professional judgment when responding. A specific answer is not anticipated or expected.   Thank You, [place] [phone]

## 2015-09-27 DIAGNOSIS — N2889 Other specified disorders of kidney and ureter: Secondary | ICD-10-CM | POA: Diagnosis not present

## 2015-09-27 DIAGNOSIS — D72829 Elevated white blood cell count, unspecified: Secondary | ICD-10-CM | POA: Diagnosis not present

## 2015-09-27 DIAGNOSIS — K2980 Duodenitis without bleeding: Secondary | ICD-10-CM | POA: Diagnosis not present

## 2015-09-27 LAB — CBC
HCT: 33.2 % — ABNORMAL LOW (ref 39.0–52.0)
Hemoglobin: 11.6 g/dL — ABNORMAL LOW (ref 13.0–17.0)
MCH: 30.4 pg (ref 26.0–34.0)
MCHC: 34.9 g/dL (ref 30.0–36.0)
MCV: 87.1 fL (ref 78.0–100.0)
Platelets: 328 10*3/uL (ref 150–400)
RBC: 3.81 MIL/uL — ABNORMAL LOW (ref 4.22–5.81)
RDW: 11.9 % (ref 11.5–15.5)
WBC: 19.4 10*3/uL — ABNORMAL HIGH (ref 4.0–10.5)

## 2015-09-27 LAB — BASIC METABOLIC PANEL
Anion gap: 8 (ref 5–15)
BUN: <5 mg/dL — ABNORMAL LOW (ref 6–20)
CO2: 25 mmol/L (ref 22–32)
Calcium: 8.5 mg/dL — ABNORMAL LOW (ref 8.9–10.3)
Chloride: 91 mmol/L — ABNORMAL LOW (ref 101–111)
Creatinine, Ser: 0.57 mg/dL — ABNORMAL LOW (ref 0.61–1.24)
GFR calc Af Amer: >60 mL/min (ref >60)
GFR calc non Af Amer: >60 mL/min (ref >60)
Glucose, Bld: 118 mg/dL — ABNORMAL HIGH (ref 65–99)
Potassium: 3.2 mmol/L — ABNORMAL LOW (ref 3.5–5.1)
Sodium: 124 mmol/L — ABNORMAL LOW (ref 135–145)

## 2015-09-27 MED ORDER — TRAMADOL HCL 50 MG PO TABS
50.0000 mg | ORAL_TABLET | Freq: Once | ORAL | Status: AC
  Administered 2015-09-27: 50 mg via ORAL
  Filled 2015-09-27: qty 1

## 2015-09-27 MED ORDER — SODIUM CHLORIDE 1 G PO TABS
2.0000 g | ORAL_TABLET | Freq: Two times a day (BID) | ORAL | Status: DC
  Administered 2015-09-27: 2 g via ORAL
  Filled 2015-09-27 (×2): qty 2

## 2015-09-27 MED ORDER — POTASSIUM CHLORIDE CRYS ER 20 MEQ PO TBCR
30.0000 meq | EXTENDED_RELEASE_TABLET | Freq: Three times a day (TID) | ORAL | Status: AC
  Administered 2015-09-27 (×3): 30 meq via ORAL
  Filled 2015-09-27 (×6): qty 1

## 2015-09-27 MED ORDER — CALCIUM CARBONATE ANTACID 500 MG PO CHEW
2.0000 | CHEWABLE_TABLET | Freq: Once | ORAL | Status: AC
  Administered 2015-09-27: 400 mg via ORAL
  Filled 2015-09-27: qty 2

## 2015-09-27 MED ORDER — CALCIUM CARBONATE ANTACID 500 MG PO CHEW
400.0000 mg | CHEWABLE_TABLET | Freq: Every day | ORAL | Status: DC | PRN
  Administered 2015-09-27: 400 mg via ORAL
  Filled 2015-09-27: qty 2

## 2015-09-27 NOTE — Progress Notes (Signed)
Patient Demographics  Louis Fox, is a 59 y.o. male, DOB - 12/05/1956, [id number]  Admit date - 09/24/2015   Admitting Physician Rise Patience, MD  Outpatient Primary MD for the patient is Tula Nakayama  LOS - 2   Chief Complaint  Patient presents with  . Abdominal Pain         Subjective:   [place] today has, No headache, No chest pain, - No Nausea,No Cough - SOB. Denies any further abdominal pain, denies nausea, vomiting or diarrhea, reports good appetite.  Assessment & Plan    Principal Problem:   Abdominal pain Active Problems:   Duodenitis   Leucocytosis   Right renal mass  Abdominal pain secondary to duodenitis - CT abdomen pelvis with evidence of duodenitis versus pancreatitis, but lipase and LFTs within normal limits. - Continue with  IV Cipro and Flagyl,  remains afebrile, leukocytosis improving. - Tolerating full liquid diet, will advance to soft diet. - Patient continues to have leukocytosis despite IV antibiotics, requested GI consult Dr. Benson Norway to evaluate the patient for possible need for EGD giving patient CT finding of wall thickening in the stomach and distal esophagus, and duodenum.  Hyponatremia - Patient with low serum osmolality, and urine osmolality of 698, picture compatible with SIADH, sodium was 124 today, continue to monitor on fluid restriction, will give salt tablets today.  Borderline troponin - this is most likely related to demand ischemia from infectious process - Monitor on telemetry, no significant arrhythmias  Leukocytosis - Trending down, continue to monitor - Negative urinalysis, no acute finding in chest x-ray.  Right renal mass - Will need to follow with urology as an outpatient  History of pseudogout - Hold NSAIDs for duodenitis  Alcohol abuse - Continue with CIWA protocol  Code Status: Full  Family  Communication: none  Disposition Plan: Pending further workup   Procedures  None   Consults  Gastroenterology   Medications  Scheduled Meds: . ciprofloxacin  400 mg Intravenous Q12H  . folic acid  1 mg Oral Daily  . heparin subcutaneous  5,000 Units Subcutaneous 3 times per day  . LORazepam  0-4 mg Intravenous Q12H  . metronidazole  500 mg Intravenous Q8H  . multivitamin with minerals  1 tablet Oral Daily  . pantoprazole  40 mg Oral Daily  . thiamine  100 mg Oral Daily   Continuous Infusions:   PRN Meds:.acetaminophen **OR** acetaminophen, bisacodyl, LORazepam **OR** LORazepam, morphine injection, ondansetron **OR** ondansetron (ZOFRAN) IV  DVT Prophylaxis - Heparin -   Lab Results  Component Value Date   PLT 328 09/27/2015    Antibiotics    Anti-infectives    Start     Dose/Rate Route Frequency Ordered Stop   09/25/15 0800  ciprofloxacin (CIPRO) IVPB 400 mg     400 mg 200 mL/hr over 60 Minutes Intravenous Every 12 hours 09/25/15 0733     09/25/15 0800  metroNIDAZOLE (FLAGYL) IVPB 500 mg     500 mg 100 mL/hr over 60 Minutes Intravenous Every 8 hours 09/25/15 0733     09/25/15 0030  amoxicillin (AMOXIL) capsule 1,000 mg     1,000 mg Oral  Once 09/25/15 0025 09/25/15 0102   09/25/15 0030  clarithromycin (BIAXIN) tablet 500  mg     500 mg Oral  Once 09/25/15 0025 09/25/15 0102          Objective:   Filed Vitals:   09/26/15 1806 09/26/15 2217 09/27/15 0522 09/27/15 0528  BP: 150/77 131/76 148/76   Pulse: 67 76 71   Temp:  99.5 F (37.5 C) 98.4 F (36.9 C)   TempSrc:  Oral Oral   Resp: 16 16 18    Height:      Weight:    78.109 kg (172 lb 3.2 oz)  SpO2: 100% 98% 95%     Wt Readings from Last 3 Encounters:  09/27/15 78.109 kg (172 lb 3.2 oz)     Intake/Output Summary (Last 24 hours) at 09/27/15 1107 Last data filed at 09/27/15 1024  Gross per 24 hour  Intake    980 ml  Output   1150 ml  Net   -170 ml     Physical Exam  Awake Alert,  Oriented X 3,  [place].AT,PERRAL Supple Neck,No JVD, No cervical lymphadenopathy appriciated.  Symmetrical Chest wall movement, Good air movement bilaterally,  RRR,No Gallops,Rubs or new Murmurs, No Parasternal Heave +ve B.Sounds, Abd Soft, nontender, No organomegaly appriciated, No rebound - guarding or rigidity. No Cyanosis, Clubbing or edema, No new Rash or bruise     Data Review   Micro Results No results found for this or any previous visit (from the past 240 hour(s)).  Radiology Reports Ct Abdomen Pelvis W Contrast  09/25/2015   CLINICAL DATA:  Generalized abdominal pain, worse in the right and left lower quadrant. Nausea and vomiting. Symptoms for 3 days.  EXAM: CT ABDOMEN AND PELVIS WITH CONTRAST  TECHNIQUE: Multidetector CT imaging of the abdomen and pelvis was performed using the standard protocol following bolus administration of intravenous contrast.  CONTRAST:  113mL OMNIPAQUE IOHEXOL 300 MG/ML  SOLN  COMPARISON:  Abdominal ultrasound earlier this day.  FINDINGS: Lower chest: Multifocal linear atelectasis in both lower lobes. No pleural effusion.  Liver: Normal in size and density without focal lesion.  Hepatobiliary: Gallbladder physiologically distended, no calcified stone. No biliary dilatation.  Pancreas: Homogeneous enhancement. Extensive fat stranding and inflammatory change about the head, uncinate process, and proximal body. No ductal dilatation. Fat stranding extends to involve the lesser sac and proximal mesentery. There is adjacent tracking free fluid, however it no pancreatic pseudocyst. Splenic vein remains patent.  Spleen: Normal.  Adrenal glands: No nodule.  Kidneys: Symmetric renal enhancement. No hydronephrosis. There is an heterogeneous 2 cm lesion in the anterior mid lower right kidney corresponding to the hyperechoic lesion on ultrasound. Tiny subcentimeter cyst in the lower left kidney.  Stomach/Bowel: Stomach is decompressed, mild wall thickening is seen involving the  gastric body. There is thickening of the included distal esophagus. Diffuse wall thickening of the entire duodenum. Adjacent soft tissue stranding involving the proximal mesentery and lesser sac with small amount tracking fluid. No pneumoperitoneum or free air. The more distal small bowel loops are decompressed. Small volume of stool in the proximal colon. Equivocal wall thickening of the ascending colon, likely reactive. The distal colon is decompressed. The appendix is prominent in size proximally measuring 8-9 mm, however normal in caliber distally in there is no surrounding inflammatory change.  Vascular/Lymphatic: No retroperitoneal adenopathy. Abdominal aorta is normal in caliber. Mild atherosclerosis of the abdominal aorta without aneurysm.  Reproductive: Prostate gland is enlarged measuring 6.0 x 4.1 cm.  Bladder: Physiologically distended, no wall thickening.  Other: Small amount of free fluid in the  pelvis, likely tracking from the upper abdominal inflammation. No abscess or loculated fluid collection.  Musculoskeletal: There are no acute or suspicious osseous abnormalities. Chondrocalcinosis incidentally noted about the pubic symphysis. There is degenerative disc disease at L5-S1.  IMPRESSION: 1. Marked inflammation with soft tissue stranding and free fluid in the upper abdomen involving the retroperitoneum and lesser sac. There is diffuse duodenum wall thickening. Findings likely reflect duodenitis versus less likely pancreatitis given the normal pancreatic enzymes. No perforation. 2. Heterogeneous 2 cm lesion in the right kidney corresponding to that seen on ultrasound. Findings are concerning for solid renal mass with likely enhancement, however enhancement characteristics are difficult to assess on the current exam. Recommend renal protocol MRI after acute symptoms have resolved. 3. Enlarged prostate gland.   Electronically Signed   By: Jeb Levering M.D.   On: 09/25/2015 00:04   Dg Abd Acute  W/chest  09/24/2015   CLINICAL DATA:  Nausea and vomiting for 3 days.  Constipation.  EXAM: DG ABDOMEN ACUTE W/ 1V CHEST  COMPARISON:  None.  FINDINGS: PA chest: There is slight atelectasis in the left base. Lungs elsewhere clear. Heart size and pulmonary vascularity are normal. No adenopathy.  Supine and upright abdomen: There is fairly diffuse stool throughout colon. Colon does not appear appreciably distended with stool, however. There is no bowel dilatation or air-fluid level suggesting obstruction. No free air. There are phleboliths in the pelvis.  IMPRESSION: Diffuse stool throughout the colon without colonic distention from stool. No obstruction or free air. Mild left base atelectasis. No frank edema or consolidation.   Electronically Signed   By: Lowella Grip III M.D.   On: 09/24/2015 17:39   US Abdomen Limited Ruq  09/24/2015   CLINICAL DATA:  Abdomen pain /cramping for 2 days  EXAM: US ABDOMEN LIMITED - RIGHT UPPER QUADRANT  COMPARISON:  None.  FINDINGS: Gallbladder:  No gallstones or wall thickening visualized. No sonographic Murphy sign noted.  Common bile duct:  Diameter: 3 mm  Liver:  No focal lesion identified. Within normal limits in parenchymal echogenicity.  There is incidental finding of a slight hyperechoic lesion in the right kidney measuring 1.6 x 1.7 x 2 cm.  IMPRESSION: No acute abnormality.  Incidental finding of a slight hyperechoic 2 cm mass in the right kidney. Recommend further evaluation with three-phase kidney CT on outpatient basis.   Electronically Signed   By: Abelardo Diesel M.D.   On: 09/24/2015 20:53     CBC  Recent Labs Lab 09/24/15 1454 09/25/15 0315 09/26/15 09/27/15 0645  WBC 29.4* 23.0* 18.2* 19.4*  HGB 14.2 12.9* 11.5* 11.6*  HCT 40.3 37.0* 33.4* 33.2*  PLT 364 251 263 328  MCV 89.6 90.9 91.5 87.1  MCH 31.6 31.7 31.5 30.4  MCHC 35.2 34.9 34.4 34.9  RDW 12.5 12.5 12.5 11.9  LYMPHSABS  --  1.1  --   --   MONOABS  --  1.7*  --   --   EOSABS  --  0.0   --   --   BASOSABS  --  0.0  --   --     Chemistries   Recent Labs Lab 09/24/15 1454 09/25/15 0315 09/26/15 09/27/15 0645  NA 127*  --  125* 124*  K 4.0  --  4.3 3.2*  CL 91*  --  94* 91*  CO2 26  --  24 25  GLUCOSE 135*  --  120* 118*  BUN 13  --  8 <5*  CREATININE  0.73  --  0.61 0.57*  CALCIUM 9.3  --  8.5* 8.5*  AST 22 17 18   --   ALT 10* 8* 9*  --   ALKPHOS 77 67 86  --   BILITOT 1.5* 1.2 1.1  --    ------------------------------------------------------------------------------------------------------------------ estimated creatinine clearance is 91.8 mL/min (by C-G formula based on Cr of 0.57). ------------------------------------------------------------------------------------------------------------------ No results for input(s): HGBA1C in the last 72 hours. ------------------------------------------------------------------------------------------------------------------ No results for input(s): CHOL, HDL, LDLCALC, TRIG, CHOLHDL, LDLDIRECT in the last 72 hours. ------------------------------------------------------------------------------------------------------------------ No results for input(s): TSH, T4TOTAL, T3FREE, THYROIDAB in the last 72 hours.  Invalid input(s): FREET3 ------------------------------------------------------------------------------------------------------------------ No results for input(s): VITAMINB12, FOLATE, FERRITIN, TIBC, IRON, RETICCTPCT in the last 72 hours.  Coagulation profile No results for input(s): INR, PROTIME in the last 168 hours.  No results for input(s): DDIMER in the last 72 hours.  Cardiac Enzymes  Recent Labs Lab 09/25/15 1317 09/25/15 1813 09/26/15  TROPONINI <0.03 0.10* 0.05*   ------------------------------------------------------------------------------------------------------------------ Invalid input(s): POCBNP     Time Spent in minutes   30 minutes   ELGERGAWY, DAWOOD M.D on 09/27/2015 at 11:07  AM  Between 7am to 7pm - Pager - [phone]  After 7pm go to [email or web address] - password [email or web address]  [facility]   Office  [phone]

## 2015-09-27 NOTE — Consult Note (Signed)
Reason for Consult: Leukocytosis and duodenitis Referring Physician: [facility]  Louis Fox HPI: This is a 59 year old gentleman without any significant PMH admitted for nausea, vomiting, and severe epigastric pain. His symptoms started acutely this past Sunday and he waited a couple of days before seeking treatment.  In the ER a CT scan revealed that there was inflammation in the duodenum and he had an accompanying leukocytosis.  No prior issues with PUD and his colonoscopy in 2008 was essentially normal.  For the past 6 months he was taking Meloxicam.  Overall he feels better as his epigastric pain is not a dull ache, but he continues to have an elevated WBC.  He has been treated with cipro, Flagyl, and pantoprazole.  Past Medical History  Diagnosis Date  . Pseudogout     Past Surgical History  Procedure Laterality Date  . Umblical hernia      Family History  Problem Relation Age of Onset  . Stroke Mother   . Hypertension Brother     Social History:  reports that he has never smoked. He does not have any smokeless tobacco history on file. He reports that he drinks alcohol. He reports that he does not use illicit drugs.  Allergies:  Allergies  Allergen Reactions  . Shellfish Allergy Swelling    Medications:  Scheduled: . ciprofloxacin  400 mg Intravenous Q12H  . folic acid  1 mg Oral Daily  . heparin subcutaneous  5,000 Units Subcutaneous 3 times per day  . LORazepam  0-4 mg Intravenous Q12H  . metronidazole  500 mg Intravenous Q8H  . multivitamin with minerals  1 tablet Oral Daily  . pantoprazole  40 mg Oral Daily  . potassium chloride  30 mEq Oral TID  . sodium chloride  2 g Oral BID WC  . thiamine  100 mg Oral Daily   Continuous:   Results for orders placed or performed during the hospital encounter of 09/24/15 (from the past 24 hour(s))  Glucose, capillary     Status: None   Collection Time: 09/26/15  6:05 PM  Result Value Ref Range   Glucose-Capillary 96 65 - 99 mg/dL  CBC     Status: Abnormal   Collection Time: 09/27/15  6:45 AM  Result Value Ref Range   WBC 19.4 (H) 4.0 - 10.5 K/uL   RBC 3.81 (L) 4.22 - 5.81 MIL/uL   Hemoglobin 11.6 (L) 13.0 - 17.0 g/dL   HCT 33.2 (L) 39.0 - 52.0 %   MCV 87.1 78.0 - 100.0 fL   MCH 30.4 26.0 - 34.0 pg   MCHC 34.9 30.0 - 36.0 g/dL   RDW 11.9 11.5 - 15.5 %   Platelets 328 150 - 400 K/uL  Basic metabolic panel     Status: Abnormal   Collection Time: 09/27/15  6:45 AM  Result Value Ref Range   Sodium 124 (L) 135 - 145 mmol/L   Potassium 3.2 (L) 3.5 - 5.1 mmol/L   Chloride 91 (L) 101 - 111 mmol/L   CO2 25 22 - 32 mmol/L   Glucose, Bld 118 (H) 65 - 99 mg/dL   BUN <5 (L) 6 - 20 mg/dL   Creatinine, Ser 0.57 (L) 0.61 - 1.24 mg/dL   Calcium 8.5 (L) 8.9 - 10.3 mg/dL   GFR calc non Af Amer >60 >60 mL/min   GFR calc Af Amer >60 >60 mL/min   Anion gap 8 5 - 15     No results found.  ROS:  As stated above in the HPI otherwise negative.  Blood pressure 148/76, pulse 71, temperature 98.4 F (36.9 C), temperature source Oral, resp. rate 18, height 5\' 7"  (1.702 m), weight 78.109 kg (172 lb 3.2 oz), SpO2 95 %.    PE: Gen: NAD, Alert and Oriented HEENT:  [place]/AT, EOMI Neck: Supple, no LAD Lungs: CTA Bilaterally CV: RRR without M/G/R ABM: Soft, epigastric tenderness, +BS Ext: No C/C/E  Assessment/Plan: 1) Duodenitis. 2) Leukocytosis.   I am unable to define the source of his problem.  Further evaluation with an EGD will be beneficial.   Plan: 1) EGD tomorrow AM with Dr. Deatra Ina. 2) Continue with current treatment.  Louis Fox,Louis Fox 09/27/2015, 12:04 PM

## 2015-09-28 ENCOUNTER — Encounter (HOSPITAL_COMMUNITY)
Admission: EM | Disposition: A | Discharge status: Home / Self Care | Payer: Self-pay | Source: Home / Self Care | Attending: Emergency Medicine

## 2015-09-28 ENCOUNTER — Encounter (HOSPITAL_COMMUNITY): Payer: Self-pay

## 2015-09-28 DIAGNOSIS — D72829 Elevated white blood cell count, unspecified: Secondary | ICD-10-CM | POA: Diagnosis not present

## 2015-09-28 DIAGNOSIS — R1013 Epigastric pain: Secondary | ICD-10-CM | POA: Diagnosis not present

## 2015-09-28 DIAGNOSIS — N2889 Other specified disorders of kidney and ureter: Secondary | ICD-10-CM | POA: Diagnosis not present

## 2015-09-28 DIAGNOSIS — K2980 Duodenitis without bleeding: Secondary | ICD-10-CM | POA: Diagnosis not present

## 2015-09-28 HISTORY — PX: ESOPHAGOGASTRODUODENOSCOPY: SHX5428

## 2015-09-28 LAB — BASIC METABOLIC PANEL
Anion gap: 7 (ref 5–15)
BUN: 6 mg/dL (ref 6–20)
CO2: 25 mmol/L (ref 22–32)
Calcium: 8.2 mg/dL — ABNORMAL LOW (ref 8.9–10.3)
Chloride: 93 mmol/L — ABNORMAL LOW (ref 101–111)
Creatinine, Ser: 0.59 mg/dL — ABNORMAL LOW (ref 0.61–1.24)
GFR calc Af Amer: >60 mL/min (ref >60)
GFR calc non Af Amer: >60 mL/min (ref >60)
Glucose, Bld: 103 mg/dL — ABNORMAL HIGH (ref 65–99)
Potassium: 3.8 mmol/L (ref 3.5–5.1)
Sodium: 125 mmol/L — ABNORMAL LOW (ref 135–145)

## 2015-09-28 LAB — CBC
HCT: 32.4 % — ABNORMAL LOW (ref 39.0–52.0)
Hemoglobin: 11.5 g/dL — ABNORMAL LOW (ref 13.0–17.0)
MCH: 31.3 pg (ref 26.0–34.0)
MCHC: 35.5 g/dL (ref 30.0–36.0)
MCV: 88.3 fL (ref 78.0–100.0)
Platelets: 272 10*3/uL (ref 150–400)
RBC: 3.67 MIL/uL — ABNORMAL LOW (ref 4.22–5.81)
RDW: 12.2 % (ref 11.5–15.5)
WBC: 15.1 10*3/uL — ABNORMAL HIGH (ref 4.0–10.5)

## 2015-09-28 SURGERY — EGD (ESOPHAGOGASTRODUODENOSCOPY)
Anesthesia: Moderate Sedation

## 2015-09-28 MED ORDER — MIDAZOLAM HCL 5 MG/ML IJ SOLN
INTRAMUSCULAR | Status: AC
  Filled 2015-09-28: qty 2

## 2015-09-28 MED ORDER — FENTANYL CITRATE (PF) 100 MCG/2ML IJ SOLN
INTRAMUSCULAR | Status: DC | PRN
  Administered 2015-09-28 (×2): 25 ug via INTRAVENOUS

## 2015-09-28 MED ORDER — MIDAZOLAM HCL 5 MG/5ML IJ SOLN
INTRAMUSCULAR | Status: DC | PRN
  Administered 2015-09-28 (×2): 2 mg via INTRAVENOUS
  Administered 2015-09-28: 1 mg via INTRAVENOUS

## 2015-09-28 MED ORDER — FENTANYL CITRATE (PF) 100 MCG/2ML IJ SOLN
INTRAMUSCULAR | Status: AC
  Filled 2015-09-28: qty 2

## 2015-09-28 MED ORDER — BUTAMBEN-TETRACAINE-BENZOCAINE 2-2-14 % EX AERO
INHALATION_SPRAY | CUTANEOUS | Status: DC | PRN
  Administered 2015-09-28: 2 via TOPICAL

## 2015-09-28 MED ORDER — DIPHENHYDRAMINE HCL 50 MG/ML IJ SOLN
INTRAMUSCULAR | Status: AC
  Filled 2015-09-28: qty 1

## 2015-09-28 MED ORDER — SODIUM CHLORIDE 0.9 % IV SOLN: INTRAVENOUS | Status: DC

## 2015-09-28 MED ORDER — MELOXICAM 15 MG PO TABS: 15.0000 mg | ORAL_TABLET | Freq: Every day | ORAL | Status: DC

## 2015-09-28 MED ORDER — METRONIDAZOLE 500 MG PO TABS: 500.0000 mg | ORAL_TABLET | Freq: Three times a day (TID) | ORAL | Status: DC

## 2015-09-28 MED ORDER — CIPROFLOXACIN HCL 500 MG PO TABS: 500.0000 mg | ORAL_TABLET | Freq: Two times a day (BID) | ORAL | Status: DC

## 2015-09-28 NOTE — Discharge Instructions (Signed)
Follow with Primary MD KAPLAN,KRISTEN, PA-C in 7 days  - Abstain from alcohol use  Get CBC, CMP, 2 view Chest X ray checked  by Primary MD next visit.    Activity: As tolerated with Full fall precautions use walker/cane & assistance as needed   Disposition Home    Diet: Heart Healthy , low fat , with feeding assistance and aspiration precautions.  For Heart failure patients - Check your Weight same time everyday, if you gain over 2 pounds, or you develop in leg swelling, experience more shortness of breath or chest pain, call your Primary MD immediately. Follow Cardiac Low Salt Diet and 1.5 lit/day fluid restriction.   On your next visit with your primary care physician please Get Medicines reviewed and adjusted.   Please request your Prim.MD to go over all Hospital Tests and Procedure/Radiological results at the follow up, please get all Hospital records sent to your Prim MD by signing hospital release before you go home.   If you experience worsening of your admission symptoms, develop shortness of breath, life threatening emergency, suicidal or homicidal thoughts you must seek medical attention immediately by calling 911 or calling your MD immediately  if symptoms less severe.  You Must read complete instructions/literature along with all the possible adverse reactions/side effects for all the Medicines you take and that have been prescribed to you. Take any new Medicines after you have completely understood and accpet all the possible adverse reactions/side effects.   Do not drive, operating heavy machinery, perform activities at heights, swimming or participation in water activities or provide baby sitting services if your were admitted for syncope or siezures until you have seen by Primary MD or a Neurologist and advised to do so again.  Do not drive when taking Pain medications.    Do not take more than prescribed Pain, Sleep and Anxiety Medications  Special Instructions: If  you have smoked or chewed Tobacco  in the last 2 yrs please stop smoking, stop any regular Alcohol  and or any Recreational drug use.  Wear Seat belts while driving.   Please note  You were cared for by a hospitalist during your hospital stay. If you have any questions about your discharge medications or the care you received while you were in the hospital after you are discharged, you can call the unit and asked to speak with the hospitalist on call if the hospitalist that took care of you is not available. Once you are discharged, your primary care physician will handle any further medical issues. Please note that NO REFILLS for any discharge medications will be authorized once you are discharged, as it is imperative that you return to your primary care physician (or establish a relationship with a primary care physician if you do not have one) for your aftercare needs so that they can reassess your need for medications and monitor your lab values.

## 2015-09-28 NOTE — Discharge Summary (Signed)
Louis Fox, is a 59 y.o. male  DOB 1956/05/23  MRN [id number].  Admission date:  09/24/2015  Admitting Physician  Rise Patience, MD  Discharge Date:  09/28/2015   Primary MD  Tula Nakayama  Recommendations for primary care physician for things to follow:  - Please check basic labs including CBC, BMP during next visit to ensure continued resolution and improvement of leukocytosis and hyponatremia. - Patient with incidental finding of 2 cm right solid mass, need follow-up as an outpatient, recommendation is for MRI renal protocol. - Patient will need lipid panel done as an outpatient, to evaluate for triglyceride as contributing factor for possible pancreatitis.   Admission Diagnosis  RUQ pain [R10.11]   Discharge Diagnosis  RUQ pain [R10.11]    Principal Problem:   Abdominal pain Active Problems:   Duodenitis   Leucocytosis   Right renal mass      Past Medical History  Diagnosis Date  . Pseudogout     Past Surgical History  Procedure Laterality Date  . Umblical hernia         History of present illness and  Hospital Course:     Kindly see H&P for history of present illness and admission details, please review complete Labs, Consult reports and Test reports for all details in brief  HPI  from the history and physical done on the day of admission  Louis Fox is a 59 y.o. male with history of pseudogout presents to the ER because of abdominal pain. Patient has been having epigastric pain with nausea vomiting multiple episodes over the last 3 days. Pain is mostly in the epigastric area nonradiating. Has not had any bowel movements for last 4 days. CT abdomen and pelvis shows inflammation around the duodenal area. Labs show normal lipase levels. Patient denies any chest pain. Has had issues of some shortness of breath when the pain started 3 days ago but presently has no  shortness of breath. Patient will be admitted for further management of possible duodenitis versus pancreatitis. Patient states he drinks liquor everyday. Has not had any previous episodes of epigastric pain. Patient was recently diagnosed with pseudogout during early part of this year for which patient has been placed on NSAIDs.   Hospital Course   Abdominal pain secondary to duodenitis versus acute pancreatitis - CT abdomen pelvis with evidence of duodenitis versus pancreatitis,  lipase and LFTs within normal limits. - Treated with IV Cipro and Flagyl during hospital stay, afebrile, abdominal pain significantly subsided, diet was advanced to soft diet, and she was tolerating very well, he continued to have leukocytosis, so GI were consulted for further evaluation, EGD was done on day of discharge with no acute findings, suspicion was for pancreatitis even though with normal lipase level, patient was advised to abstain from alcohol consumption, reports history of elevated triglycerides in the past, discussed with him if he wants to await repeat lipid panel in a.m., but patient rather go home and have this done as an outpatient.  Hyponatremia - Patient with low serum osmolality, and urine osmolality of 698, picture compatible with SIADH, sodium is 125 a day of discharge, encouraged to continue with fluid restriction,    Borderline troponin - this is most likely related to demand ischemia from infectious process - Monitored on telemetry, no significant arrhythmias  Leukocytosis - Trending down, continue to monitor - Negative urinalysis, no acute finding in chest x-ray.  Right renal mass - To be followed as an outpatient  History of pseudogout   Alcohol abuse -No Evidence of withdrawals during hospital stay     Discharge Condition:  Stable   Follow UP  Follow-up Information    Follow up with KAPLAN,KRISTEN, PA-C. Call in 3 days.   Specialty:  Family Medicine   Why:   Posthospitalization follow-up   Contact information:   [address] [place] [place] [address] [phone]         Discharge Instructions  and  Discharge Medications     Discharge Instructions    Discharge instructions    Complete by:  As directed   Follow with Primary MD KAPLAN,KRISTEN, PA-C in 7 days  - Abstain from alcohol use  Get CBC, CMP, 2 view Chest X ray checked  by Primary MD next visit.    Activity: As tolerated with Full fall precautions use walker/cane & assistance as needed   Disposition Home    Diet: Heart Healthy , low fat , with feeding assistance and aspiration precautions.  For Heart failure patients - Check your Weight same time everyday, if you gain over 2 pounds, or you develop in leg swelling, experience more shortness of breath or chest pain, call your Primary MD immediately. Follow Cardiac Low Salt Diet and 1.5 lit/day fluid restriction.   On your next visit with your primary care physician please Get Medicines reviewed and adjusted.   Please request your Prim.MD to go over all Hospital Tests and Procedure/Radiological results at the follow up, please get all Hospital records sent to your Prim MD by signing hospital release before you go home.   If you experience worsening of your admission symptoms, develop shortness of breath, life threatening emergency, suicidal or homicidal thoughts you must seek medical attention immediately by calling 911 or calling your MD immediately  if symptoms less severe.  You Must read complete instructions/literature along with all the possible adverse reactions/side effects for all the Medicines you take and that have been prescribed to you. Take any new Medicines after you have completely understood and accpet all the possible adverse reactions/side effects.   Do not drive, operating heavy machinery, perform activities at heights, swimming or participation in water activities or provide baby sitting services if your  were admitted for syncope or siezures until you have seen by Primary MD or a Neurologist and advised to do so again.  Do not drive when taking Pain medications.    Do not take more than prescribed Pain, Sleep and Anxiety Medications  Special Instructions: If you have smoked or chewed Tobacco  in the last 2 yrs please stop smoking, stop any regular Alcohol  and or any Recreational drug use.  Wear Seat belts while driving.   Please note  You were cared for by a hospitalist during your hospital stay. If you have any questions about your discharge medications or the care you received while you were in the hospital after you are discharged, you can call the unit and asked to speak with the hospitalist on call if the  hospitalist that took care of you is not available. Once you are discharged, your primary care physician will handle any further medical issues. Please note that NO REFILLS for any discharge medications will be authorized once you are discharged, as it is imperative that you return to your primary care physician (or establish a relationship with a primary care physician if you do not have one) for your aftercare needs so that they can reassess your need for medications and monitor your lab values.     Increase activity slowly    Complete by:  As directed             Medication List    TAKE these medications        acetaminophen 500 MG tablet  Commonly known as:  TYLENOL  Take 1,000 mg by mouth every 6 (six) hours as needed for mild pain.     anti-nausea solution  Take 15 mLs by mouth every 15 (fifteen) minutes as needed for nausea or vomiting.     ciprofloxacin 500 MG tablet  Commonly known as:  CIPRO  Take 1 tablet (500 mg total) by mouth 2 (two) times daily. Take for 2 days     meloxicam 15 MG tablet  Commonly known as:  MOBIC  Take 1 tablet (15 mg total) by mouth daily.  Start taking on:  09/30/2015     metroNIDAZOLE 500 MG tablet  Commonly known as:  FLAGYL  Take  1 tablet (500 mg total) by mouth 3 (three) times daily. Take for 2 days     promethazine 25 MG suppository  Commonly known as:  PHENERGAN  Place 25 mg rectally every 6 (six) hours as needed for nausea or vomiting.     ULTRAM 50 MG tablet  Generic drug:  traMADol  Take 50 mg by mouth 2 (two) times daily.          Diet and Activity recommendation: See Discharge Instructions above   Consults obtained -  Gastroenterology   Major procedures and Radiology Reports - PLEASE review detailed and final reports for all details, in brief -   Endoscopy 09/28/2015 ENDOSCOPIC IMPRESSION: Normal appearing esophagus and GE junction, the stomach was well visualized and normal in appearance, normal appearing duodenum  Ct Abdomen Pelvis W Contrast  09/25/2015   CLINICAL DATA:  Generalized abdominal pain, worse in the right and left lower quadrant. Nausea and vomiting. Symptoms for 3 days.  EXAM: CT ABDOMEN AND PELVIS WITH CONTRAST  TECHNIQUE: Multidetector CT imaging of the abdomen and pelvis was performed using the standard protocol following bolus administration of intravenous contrast.  CONTRAST:  159mL OMNIPAQUE IOHEXOL 300 MG/ML  SOLN  COMPARISON:  Abdominal ultrasound earlier this day.  FINDINGS: Lower chest: Multifocal linear atelectasis in both lower lobes. No pleural effusion.  Liver: Normal in size and density without focal lesion.  Hepatobiliary: Gallbladder physiologically distended, no calcified stone. No biliary dilatation.  Pancreas: Homogeneous enhancement. Extensive fat stranding and inflammatory change about the head, uncinate process, and proximal body. No ductal dilatation. Fat stranding extends to involve the lesser sac and proximal mesentery. There is adjacent tracking free fluid, however it no pancreatic pseudocyst. Splenic vein remains patent.  Spleen: Normal.  Adrenal glands: No nodule.  Kidneys: Symmetric renal enhancement. No hydronephrosis. There is an heterogeneous 2 cm lesion in  the anterior mid lower right kidney corresponding to the hyperechoic lesion on ultrasound. Tiny subcentimeter cyst in the lower left kidney.  Stomach/Bowel: Stomach is decompressed, mild wall thickening  is seen involving the gastric body. There is thickening of the included distal esophagus. Diffuse wall thickening of the entire duodenum. Adjacent soft tissue stranding involving the proximal mesentery and lesser sac with small amount tracking fluid. No pneumoperitoneum or free air. The more distal small bowel loops are decompressed. Small volume of stool in the proximal colon. Equivocal wall thickening of the ascending colon, likely reactive. The distal colon is decompressed. The appendix is prominent in size proximally measuring 8-9 mm, however normal in caliber distally in there is no surrounding inflammatory change.  Vascular/Lymphatic: No retroperitoneal adenopathy. Abdominal aorta is normal in caliber. Mild atherosclerosis of the abdominal aorta without aneurysm.  Reproductive: Prostate gland is enlarged measuring 6.0 x 4.1 cm.  Bladder: Physiologically distended, no wall thickening.  Other: Small amount of free fluid in the pelvis, likely tracking from the upper abdominal inflammation. No abscess or loculated fluid collection.  Musculoskeletal: There are no acute or suspicious osseous abnormalities. Chondrocalcinosis incidentally noted about the pubic symphysis. There is degenerative disc disease at L5-S1.  IMPRESSION: 1. Marked inflammation with soft tissue stranding and free fluid in the upper abdomen involving the retroperitoneum and lesser sac. There is diffuse duodenum wall thickening. Findings likely reflect duodenitis versus less likely pancreatitis given the normal pancreatic enzymes. No perforation. 2. Heterogeneous 2 cm lesion in the right kidney corresponding to that seen on ultrasound. Findings are concerning for solid renal mass with likely enhancement, however enhancement characteristics are  difficult to assess on the current exam. Recommend renal protocol MRI after acute symptoms have resolved. 3. Enlarged prostate gland.   Electronically Signed   By: Jeb Levering M.D.   On: 09/25/2015 00:04   Dg Abd Acute W/chest  09/24/2015   CLINICAL DATA:  Nausea and vomiting for 3 days.  Constipation.  EXAM: DG ABDOMEN ACUTE W/ 1V CHEST  COMPARISON:  None.  FINDINGS: PA chest: There is slight atelectasis in the left base. Lungs elsewhere clear. Heart size and pulmonary vascularity are normal. No adenopathy.  Supine and upright abdomen: There is fairly diffuse stool throughout colon. Colon does not appear appreciably distended with stool, however. There is no bowel dilatation or air-fluid level suggesting obstruction. No free air. There are phleboliths in the pelvis.  IMPRESSION: Diffuse stool throughout the colon without colonic distention from stool. No obstruction or free air. Mild left base atelectasis. No frank edema or consolidation.   Electronically Signed   By: Lowella Grip III M.D.   On: 09/24/2015 17:39   US Abdomen Limited Ruq  09/24/2015   CLINICAL DATA:  Abdomen pain /cramping for 2 days  EXAM: US ABDOMEN LIMITED - RIGHT UPPER QUADRANT  COMPARISON:  None.  FINDINGS: Gallbladder:  No gallstones or wall thickening visualized. No sonographic Murphy sign noted.  Common bile duct:  Diameter: 3 mm  Liver:  No focal lesion identified. Within normal limits in parenchymal echogenicity.  There is incidental finding of a slight hyperechoic lesion in the right kidney measuring 1.6 x 1.7 x 2 cm.  IMPRESSION: No acute abnormality.  Incidental finding of a slight hyperechoic 2 cm mass in the right kidney. Recommend further evaluation with three-phase kidney CT on outpatient basis.   Electronically Signed   By: Abelardo Diesel M.D.   On: 09/24/2015 20:53    Micro Results     No results found for this or any previous visit (from the past 240 hour(s)).     Today   Subjective:   Louis Fox today has no  headache,no chest abdominal pain,no new weakness tingling or numbness, feels much better wants to go home today.   Objective:   Blood pressure 108/65, pulse 68, temperature 98.5 F (36.9 C), temperature source Oral, resp. rate 19, height 5\' 7"  (1.702 m), weight 78.109 kg (172 lb 3.2 oz), SpO2 97 %.   Intake/Output Summary (Last 24 hours) at 09/28/15 1436 Last data filed at 09/28/15 1426  Gross per 24 hour  Intake    240 ml  Output      0 ml  Net    240 ml    Exam Awake Alert, Oriented x 3, No new F.N deficits, Normal affect [place].AT,PERRAL Supple Neck,No JVD, No cervical lymphadenopathy appriciated.  Symmetrical Chest wall movement, Good air movement bilaterally, CTAB RRR,No Gallops,Rubs or new Murmurs, No Parasternal Heave +ve B.Sounds, Abd Soft, Non tender, No organomegaly appriciated, No rebound -guarding or rigidity. No Cyanosis, Clubbing or edema, No new Rash or bruise  Data Review   CBC w Diff: Lab Results  Component Value Date   WBC 15.1* 09/28/2015   HGB 11.5* 09/28/2015   HCT 32.4* 09/28/2015   PLT 272 09/28/2015   LYMPHOPCT 5 09/25/2015   MONOPCT 7 09/25/2015   EOSPCT 0 09/25/2015   BASOPCT 0 09/25/2015    CMP: Lab Results  Component Value Date   NA 125* 09/28/2015   K 3.8 09/28/2015   CL 93* 09/28/2015   CO2 25 09/28/2015   BUN 6 09/28/2015   CREATININE 0.59* 09/28/2015   PROT 5.6* 09/26/2015   ALBUMIN 2.8* 09/26/2015   BILITOT 1.1 09/26/2015   ALKPHOS 86 09/26/2015   AST 18 09/26/2015   ALT 9* 09/26/2015  .   Total Time in preparing paper work, data evaluation and todays exam - 35 minutes  ELGERGAWY, DAWOOD M.D on 09/28/2015 at 2:36 PM  [facility]   Office  [phone]

## 2015-09-28 NOTE — Op Note (Addendum)
[facility] [place], [address]   ENDOSCOPY PROCEDURE REPORT  PATIENT: Louis Fox, Louis Fox  MR#: [id number] BIRTHDATE: September 04, 1956 , 59  yrs. old GENDER: male ENDOSCOPIST: Inda Castle, MD REFERRED BY: PROCEDURE DATE:  09/28/2015 PROCEDURE:  EGD, diagnostic ASA CLASS:     Class II INDICATIONS:  epigastric abdominal pain. MEDICATIONS: Versed 5 mg IV and Fentanyl 50 mcg IV TOPICAL ANESTHETIC:  DESCRIPTION OF PROCEDURE: After the risks benefits and alternatives of the procedure were thoroughly explained, informed consent was obtained.  The Pentax Gastroscope [id number] endoscope was introduced through the mouth and advanced to the second portion of the duodenum , Without limitations.  The instrument was slowly withdrawn as the mucosa was fully examined.      EXAM: The esophagus and gastroesophageal junction were completely normal in appearance.  The stomach was entered and closely examined.The antrum, angularis, and lesser curvature were well visualized, including a retroflexed view of the cardia and fundus. The stomach wall was normally distensable.  The scope passed easily through the pylorus into the duodenum.  Retroflexed views revealed no abnormalities.     The scope was then withdrawn from the patient and the procedure completed.  COMPLICATIONS: There were no immediate complications.  ENDOSCOPIC IMPRESSION: Normal appearing esophagus and GE junction, the stomach was well visualized and normal in appearance, normal appearing duodenum  Findings on CT may be due to pancreatitis  RECOMMENDATIONS: My Advance diet  REPEAT EXAM:  eSigned:  Inda Castle, MD 09/28/2015 10:50 AM Revised: 09/28/2015 10:50 AM   CC:  PATIENT NAME:  Louis Fox, Louis Fox MR#: [id number]

## 2015-09-28 NOTE — H&P (View-Only) (Signed)
Reason for Consult: Leukocytosis and duodenitis Referring Physician: [facility]  [name] HPI: This is a 59 year old gentleman without any significant PMH admitted for nausea, vomiting, and severe epigastric pain. His symptoms started acutely this past Sunday and he waited a couple of days before seeking treatment.  In the ER a CT scan revealed that there was inflammation in the duodenum and he had an accompanying leukocytosis.  No prior issues with PUD and his colonoscopy in 2008 was essentially normal.  For the past 6 months he was taking Meloxicam.  Overall he feels better as his epigastric pain is not a dull ache, but he continues to have an elevated WBC.  He has been treated with cipro, Flagyl, and pantoprazole.  Past Medical History  Diagnosis Date  . Pseudogout     Past Surgical History  Procedure Laterality Date  . Umblical hernia      Family History  Problem Relation Age of Onset  . Stroke Mother   . Hypertension Brother     Social History:  reports that he has never smoked. He does not have any smokeless tobacco history on file. He reports that he drinks alcohol. He reports that he does not use illicit drugs.  Allergies:  Allergies  Allergen Reactions  . Shellfish Allergy Swelling    Medications:  Scheduled: . ciprofloxacin  400 mg Intravenous Q12H  . folic acid  1 mg Oral Daily  . heparin subcutaneous  5,000 Units Subcutaneous 3 times per day  . LORazepam  0-4 mg Intravenous Q12H  . metronidazole  500 mg Intravenous Q8H  . multivitamin with minerals  1 tablet Oral Daily  . pantoprazole  40 mg Oral Daily  . potassium chloride  30 mEq Oral TID  . sodium chloride  2 g Oral BID WC  . thiamine  100 mg Oral Daily   Continuous:   Results for orders placed or performed during the hospital encounter of 09/24/15 (from the past 24 hour(s))  Glucose, capillary     Status: None   Collection Time: 09/26/15  6:05 PM  Result Value Ref Range   Glucose-Capillary 96 65 - 99 mg/dL  CBC     Status: Abnormal   Collection Time: 09/27/15  6:45 AM  Result Value Ref Range   WBC 19.4 (H) 4.0 - 10.5 K/uL   RBC 3.81 (L) 4.22 - 5.81 MIL/uL   Hemoglobin 11.6 (L) 13.0 - 17.0 g/dL   HCT 33.2 (L) 39.0 - 52.0 %   MCV 87.1 78.0 - 100.0 fL   MCH 30.4 26.0 - 34.0 pg   MCHC 34.9 30.0 - 36.0 g/dL   RDW 11.9 11.5 - 15.5 %   Platelets 328 150 - 400 K/uL  Basic metabolic panel     Status: Abnormal   Collection Time: 09/27/15  6:45 AM  Result Value Ref Range   Sodium 124 (L) 135 - 145 mmol/L   Potassium 3.2 (L) 3.5 - 5.1 mmol/L   Chloride 91 (L) 101 - 111 mmol/L   CO2 25 22 - 32 mmol/L   Glucose, Bld 118 (H) 65 - 99 mg/dL   BUN <5 (L) 6 - 20 mg/dL   Creatinine, Ser 0.57 (L) 0.61 - 1.24 mg/dL   Calcium 8.5 (L) 8.9 - 10.3 mg/dL   GFR calc non Af Amer >60 >60 mL/min   GFR calc Af Amer >60 >60 mL/min   Anion gap 8 5 - 15     No results found.  ROS:  As stated above in the HPI otherwise negative.  Blood pressure 148/76, pulse 71, temperature 98.4 F (36.9 C), temperature source Oral, resp. rate 18, height 5\' 7"  (1.702 m), weight 78.109 kg (172 lb 3.2 oz), SpO2 95 %.    PE: Gen: NAD, Alert and Oriented HEENT:  [place]/AT, EOMI Neck: Supple, no LAD Lungs: CTA Bilaterally CV: RRR without M/G/R ABM: Soft, epigastric tenderness, +BS Ext: No C/C/E  Assessment/Plan: 1) Duodenitis. 2) Leukocytosis.   I am unable to define the source of his problem.  Further evaluation with an EGD will be beneficial.   Plan: 1) EGD tomorrow AM with Dr. Deatra Ina. 2) Continue with current treatment.  HUNG,PATRICK D 09/27/2015, 12:04 PM

## 2015-09-28 NOTE — Progress Notes (Signed)
EGD is normal.  Given findings on CT I suspect pain and leukocytosis is secondary to pancreatitis. Ok to try liquids and advance as tolerated. Follow CBC

## 2015-09-28 NOTE — Progress Notes (Signed)
Nsg Discharge Note  Admit Date:  09/24/2015 Discharge date: 09/28/2015   Louis Fox to be D/C'd Home per MD order.  AVS completed.  Copy for chart, and copy for patient signed, and dated. Patient/caregiver able to verbalize understanding.  Discharge Medication:   Medication List    TAKE these medications        acetaminophen 500 MG tablet  Commonly known as:  TYLENOL  Take 1,000 mg by mouth every 6 (six) hours as needed for mild pain.     anti-nausea solution  Take 15 mLs by mouth every 15 (fifteen) minutes as needed for nausea or vomiting.     ciprofloxacin 500 MG tablet  Commonly known as:  CIPRO  Take 1 tablet (500 mg total) by mouth 2 (two) times daily. Take for 2 days     meloxicam 15 MG tablet  Commonly known as:  MOBIC  Take 1 tablet (15 mg total) by mouth daily.  Start taking on:  09/30/2015     metroNIDAZOLE 500 MG tablet  Commonly known as:  FLAGYL  Take 1 tablet (500 mg total) by mouth 3 (three) times daily. Take for 2 days     promethazine 25 MG suppository  Commonly known as:  PHENERGAN  Place 25 mg rectally every 6 (six) hours as needed for nausea or vomiting.     ULTRAM 50 MG tablet  Generic drug:  traMADol  Take 50 mg by mouth 2 (two) times daily.        Discharge Assessment: Filed Vitals:   09/28/15 1316  BP: 108/65  Pulse: 68  Temp: 98.5 F (36.9 C)  Resp: 19  Skin clean, dry and intact without evidence of skin break down, no evidence of skin tears noted. IV catheter discontinued intact. Site without signs and symptoms of complications - no redness or edema noted at insertion site, patient denies c/o pain - only slight tenderness at site.  Dressing with slight pressure applied.  D/c Instructions-Education: Discharge instructions given to patient/family with verbalized understanding. D/c education completed with patient/family including follow up instructions, medication list, d/c activities limitations if indicated, with other d/c  instructions as indicated by MD - patient able to verbalize understanding, all questions fully answered. Patient instructed to return to ED, call 911, or call MD for any changes in condition.  Patient escorted via [place], and D/C home via private auto.  [name], RN 09/28/2015 3:43 PM

## 2015-09-28 NOTE — Interval H&P Note (Signed)
History and Physical Interval Note:  09/28/2015 10:28 AM  Louis Fox  has presented today for surgery, with the diagnosis of ABM pain, duodenitis  The various methods of treatment have been discussed with the patient and family. After consideration of risks, benefits and other options for treatment, the patient has consented to  Procedure(s): ESOPHAGOGASTRODUODENOSCOPY (EGD) (N/A) as a surgical intervention .  The patient's history has been reviewed, patient examined, no change in status, stable for surgery.  I have reviewed the patient's chart and labs.  Questions were answered to the patient's satisfaction.    The recent H&P (dated *09/27/15**) was reviewed, the patient was examined and there is no change in the patients condition since that H&P was completed.   Erskine Emery  09/28/2015, 10:28 AM    Erskine Emery

## 2015-10-01 ENCOUNTER — Encounter (HOSPITAL_COMMUNITY): Payer: Self-pay | Admitting: Gastroenterology

## 2015-10-29 ENCOUNTER — Other Ambulatory Visit (HOSPITAL_COMMUNITY): Payer: Self-pay | Admitting: Urology

## 2015-10-29 ENCOUNTER — Ambulatory Visit (HOSPITAL_COMMUNITY)
Admission: RE | Admit: 2015-10-29 | Discharge: 2015-10-29 | Disposition: A | Discharge status: Home / Self Care | Payer: 59 | Source: Ambulatory Visit | Attending: Urology | Admitting: Urology

## 2015-10-29 DIAGNOSIS — D49511 Neoplasm of unspecified behavior of right kidney: Secondary | ICD-10-CM | POA: Diagnosis not present

## 2015-10-29 DIAGNOSIS — Z01818 Encounter for other preprocedural examination: Secondary | ICD-10-CM | POA: Insufficient documentation

## 2015-10-31 ENCOUNTER — Other Ambulatory Visit: Payer: Self-pay | Admitting: Urology

## 2015-11-05 NOTE — Patient Instructions (Addendum)
Louis Fox  11/05/2015   Your procedure is scheduled on: Monday 11/25/2015  Report to [facility]  Entrance take [place]  elevators to 3rd floor to  [place] at  0930 AM.  Call this number if you have problems the morning of surgery [phone]   Remember: ONLY 1 PERSON MAY GO WITH YOU TO SHORT STAY TO GET  READY MORNING OF [place].              Do not eat any solid foods after Saturday 11/23/2015!   Do not eat  drink liquids :After Midnight.the night before surgery!               FOLLOW BOWEL PREP INSTRUCTIONS FROM DR. BORDEN'S OFFICE WITH A CLEAR LIQUID DIET THE DAY BEFORE SURGERY-Sunday 11/24/2015!              DRINK 8-10 OZ. OF MAGNESIUM CITRATE BY NOON THE DAY BEFORE THE PROCEDURE AND USE FLEETS ENEMA THE NIGHT BEFORE SURGERY! PER DR. BORDEN'S INSTRUCTIONS IN EPIC!   CLEAR LIQUID DIET--FOLLOW ON Sunday 11/24/2015- ALL DAY TIL MIDNIGHT!   Foods Allowed                                                                     Foods Excluded  Coffee and tea, regular and decaf                             liquids that you cannot  Plain Jell-O in any flavor                                             see through such as: Fruit ices (not with fruit pulp)                                     milk, soups, orange juice  Iced Popsicles                                    All solid food Carbonated beverages, regular and diet                                    Cranberry, grape and apple juices Sports drinks like Gatorade Lightly seasoned clear broth or consume(fat free) Sugar, honey syrup  Sample Menu Breakfast                                Lunch                                     Supper Cranberry juice  Beef broth                            Chicken broth Jell-O                                     Grape juice                           Apple juice Coffee or tea                        Jell-O                                       Popsicle                                                Coffee or tea                        Coffee or tea  _____________________________________________________________________       Take these medicines the morning of surgery with A SIP OF WATER:  DO NOT TAKE ANY DIABETIC MEDICATIONS DAY OF YOUR SURGERY                               You may not have any metal on your body including hair pins and              piercings  Do not wear jewelry, , lotions, powders or perfumes, deodorant                         Men may shave face and neck.   Do not bring valuables to the hospital. [place].  Contacts, dentures or bridgework may not be worn into surgery.  Leave suitcase in the car. After surgery it may be brought to your room.      Special Instructions: coughing and deep breathing exercises, leg exercises               Please read over the following fact sheets you were given: _____________________________________________________________________             [facility] - Preparing for Surgery Before surgery, you can play an important role.  Because skin is not sterile, your skin needs to be as free of germs as possible.  You can reduce the number of germs on your skin by washing with CHG (chlorahexidine gluconate) soap before surgery.  CHG is an antiseptic cleaner which kills germs and bonds with the skin to continue killing germs even after washing. Please DO NOT use if you have an allergy to CHG or antibacterial soaps.  If your skin becomes reddened/irritated stop using the CHG and inform your nurse when you arrive at Short Stay. Do not shave (including legs and underarms) for at least 48 hours prior to the first CHG shower.  You may shave your face/neck. Please follow these instructions carefully:  1.  Shower with CHG Soap the night before surgery and the  morning of Surgery.  2.  If you choose to wash your hair, wash your hair first  as usual with your  normal  shampoo.  3.  After you shampoo, rinse your hair and body thoroughly to remove the  shampoo.                           4.  Use CHG as you would any other liquid soap.  You can apply chg directly  to the skin and wash                       Gently with a scrungie or clean washcloth.  5.  Apply the CHG Soap to your body ONLY FROM THE NECK DOWN.   Do not use on face/ open                           Wound or open sores. Avoid contact with eyes, ears mouth and genitals (private parts).                       Wash face,  Genitals (private parts) with your normal soap.             6.  Wash thoroughly, paying special attention to the area where your surgery  will be performed.  7.  Thoroughly rinse your body with warm water from the neck down.  8.  DO NOT shower/wash with your normal soap after using and rinsing off  the CHG Soap.                9.  Pat yourself dry with a clean towel.            10.  Wear clean pajamas.            11.  Place clean sheets on your bed the night of your first shower and do not  sleep with pets. Day of Surgery : Do not apply any lotions/deodorants the morning of surgery.  Please wear clean clothes to the hospital/surgery center.  FAILURE TO FOLLOW THESE INSTRUCTIONS MAY RESULT IN THE CANCELLATION OF YOUR SURGERY PATIENT SIGNATURE_________________________________  NURSE SIGNATURE__________________________________  ________________________________________________________________________   Louis Fox  An incentive spirometer is a tool that can help keep your lungs clear and active. This tool measures how well you are filling your lungs with each breath. Taking long deep breaths may help reverse or decrease the chance of developing breathing (pulmonary) problems (especially infection) following:  A long period of time when you are unable to move or be active. BEFORE THE PROCEDURE   If the spirometer includes an indicator to show your  best effort, your nurse or respiratory therapist will set it to a desired goal.  If possible, sit up straight or lean slightly forward. Try not to slouch.  Hold the incentive spirometer in an upright position. INSTRUCTIONS FOR USE   Sit on the edge of your bed if possible, or sit up as far as you can in bed or on a chair.  Hold the incentive spirometer in an upright position.  Breathe out normally.  Place the mouthpiece in your mouth and seal your lips tightly around it.  Breathe in slowly and as deeply as possible,  raising the piston or the ball toward the top of the column.  Hold your breath for 3-5 seconds or for as long as possible. Allow the piston or ball to fall to the bottom of the column.  Remove the mouthpiece from your mouth and breathe out normally.  Rest for a few seconds and repeat Steps 1 through 7 at least 10 times every 1-2 hours when you are awake. Take your time and take a few normal breaths between deep breaths.  The spirometer may include an indicator to show your best effort. Use the indicator as a goal to work toward during each repetition.  After each set of 10 deep breaths, practice coughing to be sure your lungs are clear. If you have an incision (the cut made at the time of surgery), support your incision when coughing by placing a pillow or rolled up towels firmly against it. Once you are able to get out of bed, walk around indoors and cough well. You may stop using the incentive spirometer when instructed by your caregiver.  RISKS AND COMPLICATIONS  Take your time so you do not get dizzy or light-headed.  If you are in pain, you may need to take or ask for pain medication before doing incentive spirometry. It is harder to take a deep breath if you are having pain. AFTER USE  Rest and breathe slowly and easily.  It can be helpful to keep track of a log of your progress. Your caregiver can provide you with a simple table to help with this. If you are  using the spirometer at home, follow these instructions: [place] IF:   You are having difficultly using the spirometer.  You have trouble using the spirometer as often as instructed.  Your pain medication is not giving enough relief while using the spirometer.  You develop fever of 100.5 F (38.1 C) or higher. SEEK IMMEDIATE MEDICAL CARE IF:   You cough up bloody sputum that had not been present before.  You develop fever of 102 F (38.9 C) or greater.  You develop worsening pain at or near the incision site. MAKE SURE YOU:   Understand these instructions.  Will watch your condition.  Will get help right away if you are not doing well or get worse. Document Released: 04/26/2007 Document Revised: 03/07/2012 Document Reviewed: 06/27/2007 ExitCare Patient Information 2014 Exit[employer], [place].   ________________________________________________________________________  WHAT IS A BLOOD TRANSFUSION? Blood Transfusion Information  A transfusion is the replacement of blood or some of its parts. Blood is made up of multiple cells which provide different functions.  Red blood cells carry oxygen and are used for blood loss replacement.  White blood cells fight against infection.  Platelets control bleeding.  Plasma helps clot blood.  Other blood products are available for specialized needs, such as hemophilia or other clotting disorders. BEFORE THE TRANSFUSION  Who gives blood for transfusions?   Healthy volunteers who are fully evaluated to make sure their blood is safe. This is blood bank blood. Transfusion therapy is the safest it has ever been in the practice of medicine. Before blood is taken from a donor, a complete history is taken to make sure that person has no history of diseases nor engages in risky social behavior (examples are intravenous drug use or sexual activity with multiple partners). The donor's travel history is screened to minimize risk of transmitting  infections, such as malaria. The donated blood is tested for signs of infectious diseases, such as  HIV and hepatitis. The blood is then tested to be sure it is compatible with you in order to minimize the chance of a transfusion reaction. If you or a relative donates blood, this is often done in anticipation of surgery and is not appropriate for emergency situations. It takes many days to process the donated blood. RISKS AND COMPLICATIONS Although transfusion therapy is very safe and saves many lives, the main dangers of transfusion include:   Getting an infectious disease.  Developing a transfusion reaction. This is an allergic reaction to something in the blood you were given. Every precaution is taken to prevent this. The decision to have a blood transfusion has been considered carefully by your caregiver before blood is given. Blood is not given unless the benefits outweigh the risks. AFTER THE TRANSFUSION  Right after receiving a blood transfusion, you will usually feel much better and more energetic. This is especially true if your red blood cells have gotten low (anemic). The transfusion raises the level of the red blood cells which carry oxygen, and this usually causes an energy increase.  The nurse administering the transfusion will monitor you carefully for complications. HOME CARE INSTRUCTIONS  No special instructions are needed after a transfusion. You may find your energy is better. Speak with your caregiver about any limitations on activity for underlying diseases you may have. SEEK MEDICAL CARE IF:   Your condition is not improving after your transfusion.  You develop redness or irritation at the intravenous (IV) site. SEEK IMMEDIATE MEDICAL CARE IF:  Any of the following symptoms occur over the next 12 hours:  Shaking chills.  You have a temperature by mouth above 102 F (38.9 C), not controlled by medicine.  Chest, back, or muscle pain.  People around you feel you are  not acting correctly or are confused.  Shortness of breath or difficulty breathing.  Dizziness and fainting.  You get a rash or develop hives.  You have a decrease in urine output.  Your urine turns a dark color or changes to pink, red, or brown. Any of the following symptoms occur over the next 10 days:  You have a temperature by mouth above 102 F (38.9 C), not controlled by medicine.  Shortness of breath.  Weakness after normal activity.  The white part of the eye turns yellow (jaundice).  You have a decrease in the amount of urine or are urinating less often.  Your urine turns a dark color or changes to pink, red, or brown. Document Released: 12/11/2000 Document Revised: 03/07/2012 Document Reviewed: 07/30/2008 [facility] Patient Information 2014 [place], [place].  _______________________________________________________________________

## 2015-11-06 NOTE — Progress Notes (Signed)
09/26/2015-noted in EPIC EKG. 10/29/2015-noted in EPIC CXR.

## 2015-11-07 ENCOUNTER — Encounter (HOSPITAL_COMMUNITY): Payer: Self-pay

## 2015-11-07 ENCOUNTER — Encounter (HOSPITAL_COMMUNITY)
Admission: RE | Admit: 2015-11-07 | Discharge: 2015-11-07 | Disposition: A | Discharge status: Home / Self Care | Payer: 59 | Source: Ambulatory Visit | Attending: Urology | Admitting: Urology

## 2015-11-07 DIAGNOSIS — N2889 Other specified disorders of kidney and ureter: Secondary | ICD-10-CM | POA: Insufficient documentation

## 2015-11-07 DIAGNOSIS — Z01818 Encounter for other preprocedural examination: Secondary | ICD-10-CM | POA: Diagnosis present

## 2015-11-07 HISTORY — DX: Family history of other diseases of the respiratory system: Z83.6

## 2015-11-07 HISTORY — DX: Gastro-esophageal reflux disease without esophagitis: K21.9

## 2015-11-07 HISTORY — DX: Unspecified osteoarthritis, unspecified site: M19.90

## 2015-11-07 LAB — CBC
HCT: 41.1 % (ref 39.0–52.0)
Hemoglobin: 14 g/dL (ref 13.0–17.0)
MCH: 30.6 pg (ref 26.0–34.0)
MCHC: 34.1 g/dL (ref 30.0–36.0)
MCV: 89.7 fL (ref 78.0–100.0)
Platelets: 291 10*3/uL (ref 150–400)
RBC: 4.58 MIL/uL (ref 4.22–5.81)
RDW: 12.6 % (ref 11.5–15.5)
WBC: 8.6 10*3/uL (ref 4.0–10.5)

## 2015-11-07 LAB — BASIC METABOLIC PANEL
Anion gap: 6 (ref 5–15)
BUN: 25 mg/dL — ABNORMAL HIGH (ref 6–20)
CO2: 27 mmol/L (ref 22–32)
Calcium: 10.1 mg/dL (ref 8.9–10.3)
Chloride: 104 mmol/L (ref 101–111)
Creatinine, Ser: 0.76 mg/dL (ref 0.61–1.24)
GFR calc Af Amer: >60 mL/min (ref >60)
GFR calc non Af Amer: >60 mL/min (ref >60)
Glucose, Bld: 95 mg/dL (ref 65–99)
Potassium: 4.5 mmol/L (ref 3.5–5.1)
Sodium: 137 mmol/L (ref 135–145)

## 2015-11-07 NOTE — Progress Notes (Signed)
09/25/15- EKG-EPIC  10/29/2015- CXR-EPIC

## 2015-11-07 NOTE — Progress Notes (Signed)
BMP results done 11/07/15 faxed via EPIC to Dr Alinda Money.

## 2015-11-20 NOTE — H&P (Signed)
Chief Complaint Right renal mass   Reason For Visit Reason for consult: To evaluate right renal mass  Provider requesting consult: Bing Matter, PA-C   History of Present Illness Mr. Louis Fox is a 59 year old gentleman who recently presented to the hospital with nausea and abdominal pain. He was diagnosed radiographically with duodenitis and possible pancreatitis. Incidentally, he was noted on ultrasound to have a hyperechoic 2 cm right renal mass that was confirmed to be present on CT imaging with contrast. This mass was hyperdense although no non-contrast images were available for comparison. It is located on the anterior interpolar region of the kidney and is about 50% exophytic. No regional lymphadenopathy, adrenal lesions, contralateral renal masses, or other evidence of metastatic disease is identified.    He does have a family history of kidney cancer with his brother having died of metastatic kidney cancer. There are no other family members with a history of kidney cancer or end-stage renal disease. He is otherwise quite healthy. He does have a history of arthritis and hyperlipidemia without other comorbidities. He has denied any other symptoms including no hematuria. He did intentionally lose approximately 20 pounds this past year although his weight has stabilized following dietary changes.   Past Medical History Problems  1. History of Chronic knee pain (M25.569,G89.29) 2. History of arthritis (Z87.39) 3. History of esophageal reflux (Z87.19) 4. History of hypercholesterolemia (Z86.39) 5. History of Hypertriglyceridemia (E78.1)  Surgical History Problems  1. History of Umbilical Hernia Repair  No mesh was used.   Current Meds 1. Acetaminophen CAPS;  Therapy: (Recorded:01Nov2016) to Recorded 2. Flax Seed Oil CAPS;  Therapy: (Recorded:01Nov2016) to Recorded 3. Meloxicam 15 MG Oral Tablet;  Therapy: (Recorded:31Oct2016) to Recorded 4. Multi-Vitamin TABS;  Therapy:  (Recorded:01Nov2016) to Recorded 5. Ultram 50 MG Oral Tablet;  Therapy: (Recorded:31Oct2016) to Recorded 6. Vitamin B Complex CAPS;  Therapy: (Recorded:01Nov2016) to Recorded 7. Vitamin E TABS;  Therapy: (Recorded:01Nov2016) to Recorded  Allergies Medication  1. No Known Drug Allergies  Family History Problems  1. Family history of cardiac disorder (Z82.49) : Grandparent 2. Family history of kidney cancer (Z80.51) : Brother 3. Family history of prostate cancer (Z80.42) : Brother 4. Family history of stroke (Z82.3) : Mother  Social History Problems    Denied: History of Alcohol use   Caffeine use (F15.90)   Married   Never a smoker  Review of Systems Genitourinary, constitutional, skin, eye, otolaryngeal, hematologic/lymphatic, cardiovascular, pulmonary, endocrine, musculoskeletal, gastrointestinal, neurological and psychiatric system(s) were reviewed and pertinent findings if present are noted and are otherwise negative.  Genitourinary: no hematuria.  Gastrointestinal: no nausea.  Hematologic/Lymphatic: no swollen glands.  Cardiovascular: no chest pain and no leg swelling.  Respiratory: no shortness of breath and no cough.    Vitals Vital Signs [Data Includes: Last 1 Day]  Recorded: [id number] 09:19AM  Height: 5 ft 6 in Weight: 160 lb  BMI Calculated: 25.82 BSA Calculated: 1.82 Blood Pressure: 125 / 85 Heart Rate: 76  Physical Exam Constitutional: Well nourished and well developed . No acute distress.  ENT:. The ears and nose are normal in appearance.  Neck: The appearance of the neck is normal and no neck mass is present.  Pulmonary: No respiratory distress, normal respiratory rhythm and effort and clear bilateral breath sounds.  Cardiovascular: Heart rate and rhythm are normal . No peripheral edema.  Abdomen: periumbilical incision site(s) well healed. The abdomen is soft and nontender. No masses are palpated. No CVA tenderness. No hernias are palpable. No  hepatosplenomegaly noted.  Lymphatics: The femoral and inguinal nodes are not enlarged or tender.  Skin: Normal skin turgor, no visible rash and no visible skin lesions.  Neuro/Psych:. Mood and affect are appropriate.    Results/Data I independently reviewed his renal ultrasound and abdominal CT scan with contrast. Findings are as dictated above.     Assessment Assessed  1. 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 CHEST X-RAY; Status:Hold For - Appointment,Print,Records,Given To Patient; Requested  for:01Nov2016;  3. CMP with Estimated GFR; Status:In Progress - Specimen/Data Collected;   Done:  [id number] 4. CT-ABD W/W/O CONTRAST; Status:Hold For - Appointment,PreCert,Date of Service,Print;  Requested for:01Nov2016;  5. VENIPUNCTURE; Status:Complete;   Done: [id number] 6. Follow-up Office  Follow-up  Status: Complete  Done: [id number]  Discussion/Summary 1. Right hyperdense renal mass: I reviewed the patient's CT scan and ultrasound with him and his wife today. I expressed the concern about a possible renal malignancy. I recommended a metastatic evaluation including chest imaging and laboratory studies as well as further definitive renal imaging of his renal mass with a CT scan pre-and postcontrast. Assuming that this mass is confirmed to be enhancing, he understands this statistically the most likely diagnosis would be renal cell carcinoma.   The patient was provided information regarding their renal mass including the relative risk of benign versus malignant pathology and the natural history of renal cell carcinoma and other possible malignancies of the kidney. The role of renal biopsy, laboratory testing, and imaging studies to further characterize renal masses and/or the presence of  metastatic disease were explained. We discussed the role of active surveillance, surgical therapy with both radical nephrectomy and nephron-sparing surgery, and ablative therapy in the treatment of renal masses. In addition, we discussed our goals of providing an accurate diagnosis and oncologic control while maintaining optimal renal function as appropriate based on the size, location, and complexity of their renal mass as well as their co-morbidities.    We have discussed the risks of treatment in detail including but not limited to bleeding, infection, heart attack, stroke, death, venothromoboembolism, cancer recurrence, injury/damage to surrounding organs and structures, urine leak, the possibility of open surgical conversion for patients undergoing minimally invasive surgery, the risk of developing chronic kidney disease and its associated implications, and the potential risk of end stage renal disease possibly necessitating dialysis.     After our discussion today, Mr. Bohr would like to proceed with his metastatic evaluation as well as definitively pre-and postcontrast imaging of his right renal mass. After discussing the possible scenario that his CT scan may confirm our suspicion of a renal malignancy, he would like to tentatively be scheduled for a right robot-assisted laparoscopic partial nephrectomy for treatment. He gives his informed consent to proceed.    Cc: Bing Matter, PA-C    Verified Results CT-ABD W/W/O CONTRAST2 [id number] 12:00AM2 Anselm Pancoast  [Nov 07, 2015 4:02PM Borden, Lester] Please notify Mr. Rhudy that I have reviewed his CT scan. It confirms our suspicion. The mass on the right kidney does enhance with contrast dye raising concern for a small kidney cancer. We should plan to proceed with surgery as scheduled.   Test Name Result Flag Reference  CT-ABD W/W/O CONTRAST2 (Report)2    ** RADIOLOGY REPORT BY [facility] **   CLINICAL DATA: RIGHT  renal mass.  Follow-up from prior exam. Subsequent evaluation  EXAM: CT ABDOMEN WITHOUT AND WITH CONTRAST  TECHNIQUE: Multidetector CT imaging of the abdomen was performed following the standard protocol before and following the bolus administration of intravenous contrast.  CONTRAST: 125 cc Isovue  COMPARISON: CT 09/24/2015 Find  FINDINGS: Lower chest: Lung bases are clear.  Hepatobiliary: No focal hepatic lesion. No biliary duct dilatation. Gallbladder is normal. Common bile duct is normal.  Pancreas: The pancreatic parenchyma is normal with uniform enhancement. The pancreatic duct is normal caliber. There is a fluid collection surrounding the uncinate process and extending ventral to the head of the pancreas along the vessels of the mesenteries (image 48, series 5 and image 65, series 5). The largest fluid collection is in this ventral mesenteric space measuring 4.5 by 2.7 cm (image 65, series 5). Additional fluid extends into the gastrohepatic ligament on image 32, series 5 measured 3.0 by 1.0 cm. These fluid collections are immediate density.  Spleen: Normal spleen  Adrenals/urinary tract: Adrenal glands normal.  Lesion of concern along the anterior cortex of the RIGHT kidney measures 21 mm on image 65, series 5. The lesion increases density on the post-contrast exam from 40 Hounsfield units to 164 Hounsfield units consistent with arterial enhancement. The enhancement pattern is heterogeneous and less than adjacent normal renal cortical parenchyma. There is moderate washout of contrast from the lesion on the delayed imaging.  No additional enhancing renal lesions are present on RIGHT. The tiny cortical low-density lesion in the LEFT kidney is present image 72, series 5 is too small to characterize.  Stomach/Bowel: Stomach, duodenum, and limited view of the bowel is unremarkable.  Vascular/Lymphatic: Abdominal aorta is normal caliber. There is no retroperitoneal  or periportal lymphadenopathy.  Other: No free fluid.  Musculoskeletal: No aggressive osseous lesion.  IMPRESSION: 1. Enhancing RIGHT renal cortical lesion consistent with a renal neoplasm. 2. Cortical hypodensity in the LEFT kidney is likely benign but too small to characterize. 3. Improvement in inflammation surrounding the pancreas with persistent fluid collections within the mesentery most suggestive of small inflammatory pseudocysts.   Electronically Signed  By: Suzy Bouchard M.D.  On: 11/07/2015 15:09   CMP with Estimated GFR1 [id number] 10:18AM1 Read Drivers  SPECIMEN TYPE: BLOOD  [Oct 29, 2015 8:54PM Borden, Lester] Notify patient that all labs are normal.   Test Name Result Flag Reference  SODIUM1 138 mmol/L1  [phone]  POTASSIUM1 4.1 mmol/L1  3.5-5.31  CHLORIDE1 105 mmol/L1  98-1101  CO21 27 mmol/L1  20-311  GLUCOSE1 82 mg/dL1  65-991  BUN1 17 mg/dL1  7-251  CREATININE1 0.78 mg/dL1  0.50-1.501  BILIRUBIN, TOTAL1 0.3 mg/dL1  0.2-1.21  ALKALINE PHOSPHATASE1 61 U/L1  40-1151  AST/SGOT1 13 U/L1  10-351  ALT/SGPT1 8 U/L1 L1 9-461  TOTAL PROTEIN1 6.3 g/dL1  6.1-8.11  ALBUMIN1 4.1 g/dL1  3.6-5.11  CALCIUM1 9.5 mg/dL1  8.6-10.31  Est GFR, African American1 >89 mL/min1  >=601  Est GFR, NonAfrican American1 >89 mL/min1  >=601  THE ESTIMATED GFR IS A CALCULATION VALID FOR ADULTS (>=74 YEARS OLD) THAT USES THE CKD-EPI ALGORITHM TO ADJUST FOR AGE AND SEX. IT IS   NOT TO BE USED FOR CHILDREN, PREGNANT WOMEN, HOSPITALIZED PATIENTS,    PATIENTS ON DIALYSIS, OR WITH RAPIDLY CHANGING KIDNEY FUNCTION. ACCORDING TO THE NKDEP, EGFR >89 IS NORMAL, 60-89 SHOWS MILD IMPAIRMENT, 30-59 SHOWS MODERATE IMPAIRMENT, 15-29 SHOWS SEVERE IMPAIRMENT AND <15 IS ESRD.     1. Amended By: Raynelle Bring; Oct 29 2015 8:54 PM EST  2.  Amended By: Raynelle Bring; Nov 07 2015 4:02 PM EST  SignaturesElectronically signed by : Raynelle Bring, M.D.; Nov 07 2015  4:02PM EST

## 2015-11-24 MED ORDER — MAGNESIUM CITRATE PO SOLN
1.0000 | Freq: Once | ORAL | Status: DC
  Filled 2015-11-24: qty 296

## 2015-11-24 MED ORDER — FLEET ENEMA 7-19 GM/118ML RE ENEM
1.0000 | ENEMA | Freq: Once | RECTAL | Status: DC
  Filled 2015-11-24: qty 1

## 2015-11-25 ENCOUNTER — Inpatient Hospital Stay (HOSPITAL_COMMUNITY): Payer: 59 | Admitting: Anesthesiology

## 2015-11-25 ENCOUNTER — Encounter (HOSPITAL_COMMUNITY)
Admission: RE | Disposition: A | Discharge status: Home / Self Care | Payer: Self-pay | Source: Ambulatory Visit | Attending: Urology

## 2015-11-25 ENCOUNTER — Inpatient Hospital Stay (HOSPITAL_COMMUNITY)
Admission: RE | Admit: 2015-11-25 | Discharge: 2015-11-27 | DRG: 658 | Disposition: A | Discharge status: Home / Self Care | Payer: 59 | Source: Ambulatory Visit | Attending: Urology | Admitting: Urology

## 2015-11-25 ENCOUNTER — Encounter (HOSPITAL_COMMUNITY): Payer: Self-pay

## 2015-11-25 DIAGNOSIS — Z8249 Family history of ischemic heart disease and other diseases of the circulatory system: Secondary | ICD-10-CM

## 2015-11-25 DIAGNOSIS — Z823 Family history of stroke: Secondary | ICD-10-CM

## 2015-11-25 DIAGNOSIS — E7800 Pure hypercholesterolemia, unspecified: Secondary | ICD-10-CM | POA: Diagnosis present

## 2015-11-25 DIAGNOSIS — Z8042 Family history of malignant neoplasm of prostate: Secondary | ICD-10-CM | POA: Diagnosis not present

## 2015-11-25 DIAGNOSIS — E781 Pure hyperglyceridemia: Secondary | ICD-10-CM | POA: Diagnosis present

## 2015-11-25 DIAGNOSIS — K2980 Duodenitis without bleeding: Secondary | ICD-10-CM | POA: Diagnosis present

## 2015-11-25 DIAGNOSIS — Z79891 Long term (current) use of opiate analgesic: Secondary | ICD-10-CM | POA: Diagnosis not present

## 2015-11-25 DIAGNOSIS — K219 Gastro-esophageal reflux disease without esophagitis: Secondary | ICD-10-CM | POA: Diagnosis present

## 2015-11-25 DIAGNOSIS — M25511 Pain in right shoulder: Secondary | ICD-10-CM | POA: Diagnosis not present

## 2015-11-25 DIAGNOSIS — M25569 Pain in unspecified knee: Secondary | ICD-10-CM | POA: Diagnosis present

## 2015-11-25 DIAGNOSIS — N2889 Other specified disorders of kidney and ureter: Secondary | ICD-10-CM | POA: Diagnosis present

## 2015-11-25 DIAGNOSIS — Z01812 Encounter for preprocedural laboratory examination: Secondary | ICD-10-CM | POA: Diagnosis not present

## 2015-11-25 DIAGNOSIS — Z8051 Family history of malignant neoplasm of kidney: Secondary | ICD-10-CM

## 2015-11-25 DIAGNOSIS — Z79899 Other long term (current) drug therapy: Secondary | ICD-10-CM | POA: Diagnosis not present

## 2015-11-25 DIAGNOSIS — D49519 Neoplasm of unspecified behavior of unspecified kidney: Secondary | ICD-10-CM | POA: Diagnosis present

## 2015-11-25 DIAGNOSIS — G8929 Other chronic pain: Secondary | ICD-10-CM | POA: Diagnosis present

## 2015-11-25 DIAGNOSIS — M1990 Unspecified osteoarthritis, unspecified site: Secondary | ICD-10-CM | POA: Diagnosis present

## 2015-11-25 DIAGNOSIS — C649 Malignant neoplasm of unspecified kidney, except renal pelvis: Secondary | ICD-10-CM | POA: Diagnosis present

## 2015-11-25 HISTORY — PX: ROBOT ASSISTED LAPAROSCOPIC NEPHRECTOMY: SHX5140

## 2015-11-25 LAB — BASIC METABOLIC PANEL
Anion gap: 7 (ref 5–15)
BUN: 10 mg/dL (ref 6–20)
CO2: 28 mmol/L (ref 22–32)
Calcium: 9.2 mg/dL (ref 8.9–10.3)
Chloride: 103 mmol/L (ref 101–111)
Creatinine, Ser: 0.88 mg/dL (ref 0.61–1.24)
GFR calc Af Amer: >60 mL/min (ref >60)
GFR calc non Af Amer: >60 mL/min (ref >60)
Glucose, Bld: 156 mg/dL — ABNORMAL HIGH (ref 65–99)
Potassium: 4.5 mmol/L (ref 3.5–5.1)
Sodium: 138 mmol/L (ref 135–145)

## 2015-11-25 LAB — TYPE AND SCREEN
ABO/RH(D): A POS
Antibody Screen: NEGATIVE

## 2015-11-25 LAB — ABO/RH: ABO/RH(D): A POS

## 2015-11-25 LAB — HEMOGLOBIN AND HEMATOCRIT, BLOOD
HCT: 38.8 % — ABNORMAL LOW (ref 39.0–52.0)
Hemoglobin: 13.3 g/dL (ref 13.0–17.0)

## 2015-11-25 SURGERY — ROBOTIC ASSISTED LAPAROSCOPIC NEPHRECTOMY
Anesthesia: General | Laterality: Right

## 2015-11-25 MED ORDER — PROPOFOL 10 MG/ML IV BOLUS
INTRAVENOUS | Status: DC | PRN
  Administered 2015-11-25: 150 mg via INTRAVENOUS

## 2015-11-25 MED ORDER — MANNITOL 25 % IV SOLN
INTRAVENOUS | Status: DC | PRN
  Administered 2015-11-25 (×2): 12.5 g via INTRAVENOUS

## 2015-11-25 MED ORDER — ONDANSETRON HCL 4 MG/2ML IJ SOLN
INTRAMUSCULAR | Status: DC | PRN
  Administered 2015-11-25: 4 mg via INTRAVENOUS

## 2015-11-25 MED ORDER — STERILE WATER FOR IRRIGATION IR SOLN
Status: DC | PRN
  Administered 2015-11-25: 1000 mL

## 2015-11-25 MED ORDER — LIDOCAINE HCL (CARDIAC) 20 MG/ML IV SOLN
INTRAVENOUS | Status: DC | PRN
  Administered 2015-11-25: 100 mg via INTRAVENOUS

## 2015-11-25 MED ORDER — BUPIVACAINE LIPOSOME 1.3 % IJ SUSP
20.0000 mL | Freq: Once | INTRAMUSCULAR | Status: AC
  Administered 2015-11-25: 20 mL
  Filled 2015-11-25: qty 20

## 2015-11-25 MED ORDER — NEOSTIGMINE METHYLSULFATE 10 MG/10ML IV SOLN
INTRAVENOUS | Status: AC
  Filled 2015-11-25: qty 1

## 2015-11-25 MED ORDER — LACTATED RINGERS IR SOLN
Status: DC | PRN
  Administered 2015-11-25: 1

## 2015-11-25 MED ORDER — GLYCOPYRROLATE 0.2 MG/ML IJ SOLN
INTRAMUSCULAR | Status: AC
  Filled 2015-11-25: qty 3

## 2015-11-25 MED ORDER — HYDROCODONE-ACETAMINOPHEN 5-325 MG PO TABS: 1.0000 | ORAL_TABLET | Freq: Four times a day (QID) | ORAL | Status: DC | PRN

## 2015-11-25 MED ORDER — HYDROMORPHONE HCL 1 MG/ML IJ SOLN
INTRAMUSCULAR | Status: AC
Start: 2015-11-25 — End: 2015-11-26
  Filled 2015-11-25: qty 1

## 2015-11-25 MED ORDER — ACETAMINOPHEN 10 MG/ML IV SOLN
1000.0000 mg | Freq: Four times a day (QID) | INTRAVENOUS | Status: AC
Start: 2015-11-25 — End: 2015-11-26
  Administered 2015-11-25 – 2015-11-26 (×4): 1000 mg via INTRAVENOUS
  Filled 2015-11-25 (×4): qty 100

## 2015-11-25 MED ORDER — FENTANYL CITRATE (PF) 250 MCG/5ML IJ SOLN
INTRAMUSCULAR | Status: DC | PRN
  Administered 2015-11-25 (×7): 50 ug via INTRAVENOUS

## 2015-11-25 MED ORDER — DEXAMETHASONE SODIUM PHOSPHATE 10 MG/ML IJ SOLN
INTRAMUSCULAR | Status: DC | PRN
  Administered 2015-11-25: 10 mg via INTRAVENOUS

## 2015-11-25 MED ORDER — GLYCOPYRROLATE 0.2 MG/ML IJ SOLN
INTRAMUSCULAR | Status: DC | PRN
  Administered 2015-11-25: 0.6 mg via INTRAVENOUS

## 2015-11-25 MED ORDER — SUCCINYLCHOLINE CHLORIDE 20 MG/ML IJ SOLN
INTRAMUSCULAR | Status: DC | PRN
  Administered 2015-11-25: 100 mg via INTRAVENOUS

## 2015-11-25 MED ORDER — CEFAZOLIN SODIUM-DEXTROSE 2-3 GM-% IV SOLR
2.0000 g | INTRAVENOUS | Status: AC
  Administered 2015-11-25: 2 g via INTRAVENOUS

## 2015-11-25 MED ORDER — DEXTROSE-NACL 5-0.45 % IV SOLN
INTRAVENOUS | Status: DC
  Administered 2015-11-25 – 2015-11-26 (×2): via INTRAVENOUS

## 2015-11-25 MED ORDER — CEFAZOLIN SODIUM 1-5 GM-% IV SOLN
INTRAVENOUS | Status: AC
  Filled 2015-11-25: qty 50

## 2015-11-25 MED ORDER — MANNITOL 25 % IV SOLN
INTRAVENOUS | Status: AC
  Filled 2015-11-25: qty 50

## 2015-11-25 MED ORDER — LIDOCAINE HCL (CARDIAC) 20 MG/ML IV SOLN
INTRAVENOUS | Status: AC
  Filled 2015-11-25: qty 5

## 2015-11-25 MED ORDER — DOCUSATE SODIUM 100 MG PO CAPS
100.0000 mg | ORAL_CAPSULE | Freq: Two times a day (BID) | ORAL | Status: DC
  Administered 2015-11-25 – 2015-11-26 (×3): 100 mg via ORAL
  Filled 2015-11-25 (×5): qty 1

## 2015-11-25 MED ORDER — DIPHENHYDRAMINE HCL 12.5 MG/5ML PO ELIX: 12.5000 mg | ORAL_SOLUTION | Freq: Four times a day (QID) | ORAL | Status: DC | PRN

## 2015-11-25 MED ORDER — PROPOFOL 10 MG/ML IV BOLUS
INTRAVENOUS | Status: AC
  Filled 2015-11-25: qty 20

## 2015-11-25 MED ORDER — CISATRACURIUM BESYLATE (PF) 10 MG/5ML IV SOLN
INTRAVENOUS | Status: DC | PRN
  Administered 2015-11-25: 4 mg via INTRAVENOUS
  Administered 2015-11-25: 6 mg via INTRAVENOUS
  Administered 2015-11-25: 8 mg via INTRAVENOUS

## 2015-11-25 MED ORDER — MIDAZOLAM HCL 5 MG/5ML IJ SOLN
INTRAMUSCULAR | Status: DC | PRN
  Administered 2015-11-25: 2 mg via INTRAVENOUS

## 2015-11-25 MED ORDER — CISATRACURIUM BESYLATE 20 MG/10ML IV SOLN
INTRAVENOUS | Status: AC
  Filled 2015-11-25: qty 10

## 2015-11-25 MED ORDER — LACTATED RINGERS IV SOLN
INTRAVENOUS | Status: DC
  Administered 2015-11-25 (×2): via INTRAVENOUS
  Administered 2015-11-25: 1000 mL via INTRAVENOUS

## 2015-11-25 MED ORDER — HYDROMORPHONE HCL 1 MG/ML IJ SOLN
0.2500 mg | INTRAMUSCULAR | Status: DC | PRN
  Administered 2015-11-25 (×3): 0.5 mg via INTRAVENOUS

## 2015-11-25 MED ORDER — LACTATED RINGERS IV SOLN: INTRAVENOUS | Status: DC

## 2015-11-25 MED ORDER — ONDANSETRON HCL 4 MG/2ML IJ SOLN: 4.0000 mg | INTRAMUSCULAR | Status: DC | PRN

## 2015-11-25 MED ORDER — MIDAZOLAM HCL 2 MG/2ML IJ SOLN
INTRAMUSCULAR | Status: AC
  Filled 2015-11-25: qty 2

## 2015-11-25 MED ORDER — DEXAMETHASONE SODIUM PHOSPHATE 10 MG/ML IJ SOLN
INTRAMUSCULAR | Status: AC
  Filled 2015-11-25: qty 1

## 2015-11-25 MED ORDER — SODIUM CHLORIDE 0.9 % IJ SOLN
INTRAMUSCULAR | Status: DC | PRN
  Administered 2015-11-25: 20 mL via INTRAVENOUS

## 2015-11-25 MED ORDER — DIPHENHYDRAMINE HCL 50 MG/ML IJ SOLN: 12.5000 mg | Freq: Four times a day (QID) | INTRAMUSCULAR | Status: DC | PRN

## 2015-11-25 MED ORDER — NEOSTIGMINE METHYLSULFATE 10 MG/10ML IV SOLN
INTRAVENOUS | Status: DC | PRN
  Administered 2015-11-25: 4 mg via INTRAVENOUS

## 2015-11-25 MED ORDER — FENTANYL CITRATE (PF) 100 MCG/2ML IJ SOLN
INTRAMUSCULAR | Status: AC
  Filled 2015-11-25: qty 2

## 2015-11-25 MED ORDER — SODIUM CHLORIDE 0.9 % IJ SOLN
INTRAMUSCULAR | Status: AC
  Filled 2015-11-25: qty 20

## 2015-11-25 MED ORDER — CEFAZOLIN SODIUM 1-5 GM-% IV SOLN
1.0000 g | Freq: Three times a day (TID) | INTRAVENOUS | Status: AC
  Administered 2015-11-25 – 2015-11-26 (×2): 1 g via INTRAVENOUS
  Filled 2015-11-25 (×2): qty 50

## 2015-11-25 MED ORDER — MORPHINE SULFATE (PF) 2 MG/ML IV SOLN
2.0000 mg | INTRAVENOUS | Status: DC | PRN
  Administered 2015-11-25 – 2015-11-26 (×4): 2 mg via INTRAVENOUS
  Filled 2015-11-25 (×3): qty 1

## 2015-11-25 MED ORDER — BUPIVACAINE-EPINEPHRINE (PF) 0.25% -1:200000 IJ SOLN
INTRAMUSCULAR | Status: AC
  Filled 2015-11-25: qty 30

## 2015-11-25 MED ORDER — FENTANYL CITRATE (PF) 250 MCG/5ML IJ SOLN
INTRAMUSCULAR | Status: AC
  Filled 2015-11-25: qty 5

## 2015-11-25 MED ORDER — ONDANSETRON HCL 4 MG/2ML IJ SOLN
INTRAMUSCULAR | Status: AC
  Filled 2015-11-25: qty 2

## 2015-11-25 MED ORDER — MANNITOL 25 % IV SOLN
INTRAVENOUS | Status: AC
  Filled 2015-11-25: qty 100

## 2015-11-25 SURGICAL SUPPLY — 49 items
APPLICATOR SURGIFLO ENDO (HEMOSTASIS) ×2 IMPLANT
CABLE HIGH FREQUENCY MONO STRZ (ELECTRODE) ×2 IMPLANT
CHLORAPREP W/TINT 26ML (MISCELLANEOUS) ×2 IMPLANT
CLIP LIGATING HEM O LOK PURPLE (MISCELLANEOUS) ×2 IMPLANT
CLIP LIGATING HEMO O LOK GREEN (MISCELLANEOUS) ×4 IMPLANT
CORDS BIPOLAR (ELECTRODE) ×2 IMPLANT
COVER SURGICAL LIGHT HANDLE (MISCELLANEOUS) ×2 IMPLANT
COVER TIP SHEARS 8 DVNC (MISCELLANEOUS) ×1 IMPLANT
COVER TIP SHEARS 8MM DA VINCI (MISCELLANEOUS) ×1
DECANTER SPIKE VIAL GLASS SM (MISCELLANEOUS) ×2 IMPLANT
DRAIN CHANNEL 15F RND FF 3/16 (WOUND CARE) ×2 IMPLANT
DRAPE INCISE IOBAN 66X45 STRL (DRAPES) ×2 IMPLANT
DRAPE SHEET LG 3/4 BI-LAMINATE (DRAPES) ×2 IMPLANT
DRAPE WARM FLUID 44X44 (DRAPE) ×2 IMPLANT
ELECT PENCIL ROCKER SW 15FT (MISCELLANEOUS) ×2 IMPLANT
ELECT REM PT RETURN 9FT ADLT (ELECTROSURGICAL) ×2
ELECTRODE REM PT RTRN 9FT ADLT (ELECTROSURGICAL) ×1 IMPLANT
EVACUATOR SILICONE 100CC (DRAIN) ×2 IMPLANT
GLOVE BIO SURGEON STRL SZ 6.5 (GLOVE) ×2 IMPLANT
GLOVE BIOGEL M STRL SZ7.5 (GLOVE) ×4 IMPLANT
GOWN STRL REUS W/TWL LRG LVL3 (GOWN DISPOSABLE) ×6 IMPLANT
KIT ACCESSORY DA VINCI DISP (KITS) ×1
KIT ACCESSORY DVNC DISP (KITS) ×1 IMPLANT
KIT BASIN OR (CUSTOM PROCEDURE TRAY) ×2 IMPLANT
LIQUID BAND (GAUZE/BANDAGES/DRESSINGS) ×4 IMPLANT
NS IRRIG 1000ML POUR BTL (IV SOLUTION) ×2 IMPLANT
POSITIONER SURGICAL ARM (MISCELLANEOUS) ×4 IMPLANT
POUCH SPECIMEN RETRIEVAL 10MM (ENDOMECHANICALS) ×2 IMPLANT
SET TUBE IRRIG SUCTION NO TIP (IRRIGATION / IRRIGATOR) ×2 IMPLANT
SOLUTION ELECTROLUBE (MISCELLANEOUS) ×2 IMPLANT
SURGIFLO W/THROMBIN 8M KIT (HEMOSTASIS) ×2 IMPLANT
SUT ETHILON 3 0 PS 1 (SUTURE) ×2 IMPLANT
SUT MNCRL AB 4-0 PS2 18 (SUTURE) ×4 IMPLANT
SUT V-LOC BARB 180 2/0GR6 GS22 (SUTURE) ×2
SUT VIC AB 0 CT1 27 (SUTURE) ×1
SUT VIC AB 0 CT1 27XBRD ANTBC (SUTURE) ×1 IMPLANT
SUT VIC AB 0 UR5 27 (SUTURE) ×2 IMPLANT
SUT VICRYL 0 UR6 27IN ABS (SUTURE) ×4 IMPLANT
SUT VLOC BARB 180 ABS3/0GR12 (SUTURE) ×2
SUTURE V-LC BRB 180 2/0GR6GS22 (SUTURE) ×1 IMPLANT
SUTURE VLOC BRB 180 ABS3/0GR12 (SUTURE) ×1 IMPLANT
TOWEL OR NON WOVEN STRL DISP B (DISPOSABLE) ×2 IMPLANT
TRAY FOLEY W/METER SILVER 14FR (SET/KITS/TRAYS/PACK) ×2 IMPLANT
TRAY FOLEY W/METER SILVER 16FR (SET/KITS/TRAYS/PACK) ×2 IMPLANT
TRAY LAPAROSCOPIC (CUSTOM PROCEDURE TRAY) ×2 IMPLANT
TROCAR BLADELESS OPT 5 75 (ENDOMECHANICALS) IMPLANT
TROCAR UNIVERSAL OPT 12M 100M (ENDOMECHANICALS) ×2 IMPLANT
TROCAR XCEL 12X100 BLDLESS (ENDOMECHANICALS) ×2 IMPLANT
WATER STERILE IRR 1500ML POUR (IV SOLUTION) ×2 IMPLANT

## 2015-11-25 NOTE — Transfer of Care (Signed)
Immediate Anesthesia Transfer of Care Note  Patient: Louis Fox  Procedure(s) Performed: Procedure(s): ROBOTIC ASSISTED LAPAROSCOPIC  PARTIAL NEPHRECTOMY (Right)  Patient Location: PACU  Anesthesia Type:General  Level of Consciousness: awake, alert , oriented and patient cooperative  Airway & Oxygen Therapy: Patient Spontanous Breathing and Patient connected to face mask oxygen  Post-op Assessment: Report given to RN and Post -op Vital signs reviewed and stable  Post vital signs: Reviewed and stable  Last Vitals:  Filed Vitals:   11/25/15 0923  BP: 129/86  Pulse: 71  Temp: 36.6 C  Resp: 16    Complications: No apparent anesthesia complications

## 2015-11-25 NOTE — Interval H&P Note (Signed)
History and Physical Interval Note:  11/25/2015 10:56 AM  Louis Fox  has presented today for surgery, with the diagnosis of RIGHT RENAL MASS  The various methods of treatment have been discussed with the patient and family. After consideration of risks, benefits and other options for treatment, the patient has consented to  Procedure(s): ROBOTIC ASSISTED LAPAROSCOPIC  PARTIAL NEPHRECTOMY (Right) as a surgical intervention .  The patient's history has been reviewed, patient examined, no change in status, stable for surgery.  I have reviewed the patient's chart and labs.  Questions were answered to the patient's satisfaction.     [name],LES

## 2015-11-25 NOTE — Anesthesia Procedure Notes (Signed)
Procedure Name: Intubation Date/Time: 11/25/2015 11:59 AM Performed by: Dione Booze Pre-anesthesia Checklist: Patient identified, Emergency Drugs available, Suction available and Patient being monitored Patient Re-evaluated:Patient Re-evaluated prior to inductionOxygen Delivery Method: Circle system utilized Preoxygenation: Pre-oxygenation with 100% oxygen Intubation Type: IV induction Laryngoscope Size: Mac and 4 Grade View: Grade I Tube type: Oral Tube size: 7.5 mm Number of attempts: 1 Airway Equipment and Method: Stylet Placement Confirmation: ETT inserted through vocal cords under direct vision and positive ETCO2 Secured at: 22 cm Tube secured with: Tape Dental Injury: Teeth and Oropharynx as per pre-operative assessment

## 2015-11-25 NOTE — Discharge Instructions (Signed)

## 2015-11-25 NOTE — Op Note (Signed)
Preoperative diagnosis: Right renal mass  Postoperative diagnosis: Right renal mass  Procedure:  1. Right robotic-assisted laparoscopic partial nephrectomy  Surgeon: Pryor Curia. M.D.  Resident: E. Harvel Ricks, M.D.  Assistant(s): Debbrah Alar, P.A.  Anesthesia: General  Complications: None  IVF: 2.0 L crystalloid  EBL: 100 mL  Specimens: 1. Right renal mass  Disposition of specimens: Pathology  Intraoperative findings:       1. Warm renal ischemia time: 14 minutes  Drains: 1. # 15 Blake perinephric drain  Indication:  Louis Fox is a 59 y.o. year old patient with a right renal mass.  After a thorough review of the management options for their renal mass, they elected to proceed with surgical treatment and the above procedure.  We have discussed the potential benefits and risks of the procedure, side effects of the proposed treatment, the likelihood of the patient achieving the goals of the procedure, and any potential problems that might occur during the procedure or recuperation. Informed consent has been obtained.   Description of procedure:  The patient was taken to the operating room and a general anesthetic was administered. The patient was given preoperative antibiotics, placed in the right modified flank position with care to pad all potential pressure points, and prepped and draped in the usual sterile fashion. Next a preoperative timeout was performed.  A site was selected on the right side of the umbilicus for placement of the camera port. This was placed using a standard open Hassan technique which allowed entry into the peritoneal cavity under direct vision and without difficulty. A 12 mm port was placed and a pneumoperitoneum established. The camera was then used to inspect the abdomen and there was no evidence of any intra-abdominal injuries or other abnormalities. The remaining abdominal ports were then placed. 8 mm robotic ports were placed  in the right upper quadrant, right lower quadrant, and far right lateral abdominal wall. A 12 mm port was placed in the upper midline for laparoscopic assistance. All ports were placed under direct vision without difficulty. The surgical cart was then docked.   Utilizing the cautery scissors, the white line of Toldt was incised allowing the colon to be mobilized medially and the plane between the mesocolon and the anterior layer of Gerota's fascia to be developed and the kidney to be exposed.  The ureter and gonadal vein were identified inferiorly and the ureter and gonadal vein were lifted anteriorly off the psoas muscle.  Dissection proceeded superiorly along the gonadal vein. We then dropped the gonadal vein inferiorly keeping the ureter up with the kidney. We proceeded superiorly until the lower of the two renal arteries was identified.  The renal hilum was then carefully isolated with a combination of blunt and sharp dissection allowing the two renal arterial and single venous structures to be separated and isolated in preparation for renal hilar vessel clamping.   Attention turned to the kidney and the perinephric fat surrounding the renal mass was removed and the kidney was mobilized sufficiently for exposure and resection of the renal mass. 12.5 g of IV mannitol was then administered.   The tumor was significantly exophytic with clear margins evident and intra-operative ultrasound was bypassed. The intended line of incision surrounding the tumor was marked out with electrocautery circumferentially making sure to leave a small margin of normal renal parenchyma around the tumor. Once the renal mass was properly isolated, preparations were made for resection of the tumor.  Reconstructive sutures were placed into the  abdomen for the renorrhaphy portion of the procedure.  Both renal arteries were then clamped with each with a single bulldog clamp.  The tumor was then excised with cold scissor dissection  along with an adequate visible gross margin of normal renal parenchyma. The tumor appeared to be excised without any gross violation of the tumor. The renal collecting system was entered during removal of the tumor.  A running 3-0 V-lock suture was then brought through the capsule of the kidney and run along the base of the renal defect to provide hemostasis and close the entry into the renal collecting system. Weck clips were used to secure this suture outside the renal capsule at the proximal and distal ends. An additional hemostatic agent (Surgiflo) was then placed into the renal defect. A running 2-0 V lock suture was then used to close the capsule of the kidney using a sliding clip technique which resulted in excellent hemostasis.    The bulldog clamps were then removed from the renal hilar arteries and an additional 12.5 g of IV mannitol was administered. Total warm renal ischemia time was 14 minutes. The renal tumor resection site was examined. Hemostasis appeared adequate.   The kidney was placed back into its normal anatomic position and covered with perinephric fat as needed.  A # 81 Blake drain was then brought through the lateral lower port site and positioned in the perinephric space.  It was secured to the skin with a nylon suture. The surgical cart was undocked.  The renal tumor specimen was removed intact within an endopouch retrieval bag via the camera port sites.  The camera port site and the other 12 mm port site were then closed at the fascial level with 0-vicryl suture.  All other laparoscopic/robotic ports were removed under direct vision and the pneumoperitoneum let down with inspection of the operative field performed and hemostasis again confirmed. All incision sites were then injected with local anesthetic and reapproximated at the skin level with 4-0 monocryl subcuticular closures.  Dermabond was applied to the skin.  The patient tolerated the procedure well and without complications.   The patient was able to be extubated and transferred to the recovery unit in satisfactory condition.

## 2015-11-25 NOTE — Anesthesia Preprocedure Evaluation (Addendum)
Anesthesia Evaluation  Patient identified by MRN, date of birth, ID band Patient awake    Reviewed: Allergy & Precautions, H&P , NPO status , Patient's Chart, lab work & pertinent test results  Airway Mallampati: II  TM Distance: >3 FB Neck ROM: full    Dental  (+) Dental Advisory Given, Caps, Chipped Right front capped. Left front old chip:   Pulmonary neg pulmonary ROS,    Pulmonary exam normal breath sounds clear to auscultation       Cardiovascular Exercise Tolerance: Good negative cardio ROS Normal cardiovascular exam Rhythm:regular Rate:Normal     Neuro/Psych negative neurological ROS  negative psych ROS   GI/Hepatic negative GI ROS, Neg liver ROS,   Endo/Other  negative endocrine ROS  Renal/GU negative Renal ROS  negative genitourinary   Musculoskeletal   Abdominal   Peds  Hematology negative hematology ROS (+)   Anesthesia Other Findings   Reproductive/Obstetrics negative OB ROS                            Anesthesia Physical Anesthesia Plan  ASA: II  Anesthesia Plan: General   Post-op Pain Management:    Induction: Intravenous  Airway Management Planned: Oral ETT  Additional Equipment:   Intra-op Plan:   Post-operative Plan: Extubation in OR  Informed Consent: I have reviewed the patients History and Physical, chart, labs and discussed the procedure including the risks, benefits and alternatives for the proposed anesthesia with the patient or authorized representative who has indicated his/her understanding and acceptance.   Dental Advisory Given  Plan Discussed with: CRNA and Surgeon  Anesthesia Plan Comments:         Anesthesia Quick Evaluation

## 2015-11-25 NOTE — Progress Notes (Signed)
Patient ID: Louis Fox, male   DOB: 1956-01-16, 59 y.o.   MRN: [id number]  Post-op note  Subjective: The patient is doing well.  No complaints.  Objective: Vital signs in last 24 hours: Temp:  [97.4 F (36.3 C)-98 F (36.7 C)] 97.9 F (36.6 C) (11/28 1600) Pulse Rate:  [62-80] 79 (11/28 1600) Resp:  [11-16] 12 (11/28 1600) BP: (129-168)/(82-97) 160/97 mmHg (11/28 1600) SpO2:  [99 %-100 %] 99 % (11/28 1600) Weight:  [71.215 kg (157 lb)] 71.215 kg (157 lb) (11/28 0935)  Intake/Output from previous day:   Intake/Output this shift: Total I/O In: 2300 [I.V.:2300] Out: 350 [Urine:225; Drains:25; Blood:100]  Physical Exam:  General: Alert and oriented. Abdomen: Soft, Nondistended. Incisions: Clean and dry.  Lab Results:  Recent Labs  11/25/15 1520  HGB 13.3  HCT 38.8*    Assessment/Plan: POD#0   1) Continue to monitor, bedrest tonight   Pryor Curia. MD   LOS: 0 days   Aleea Hendry,LES 11/25/2015, 4:38 PM

## 2015-11-26 ENCOUNTER — Encounter (HOSPITAL_COMMUNITY): Payer: Self-pay | Admitting: Urology

## 2015-11-26 LAB — BASIC METABOLIC PANEL
Anion gap: 4 — ABNORMAL LOW (ref 5–15)
BUN: 8 mg/dL (ref 6–20)
CO2: 27 mmol/L (ref 22–32)
Calcium: 8.9 mg/dL (ref 8.9–10.3)
Chloride: 105 mmol/L (ref 101–111)
Creatinine, Ser: 0.8 mg/dL (ref 0.61–1.24)
GFR calc Af Amer: >60 mL/min (ref >60)
GFR calc non Af Amer: >60 mL/min (ref >60)
Glucose, Bld: 132 mg/dL — ABNORMAL HIGH (ref 65–99)
Potassium: 4 mmol/L (ref 3.5–5.1)
Sodium: 136 mmol/L (ref 135–145)

## 2015-11-26 LAB — HEMOGLOBIN AND HEMATOCRIT, BLOOD
HCT: 34.4 % — ABNORMAL LOW (ref 39.0–52.0)
Hemoglobin: 11.8 g/dL — ABNORMAL LOW (ref 13.0–17.0)

## 2015-11-26 LAB — CREATININE, FLUID (PLEURAL, PERITONEAL, JP DRAINAGE): Creat, Fluid: 0.9 mg/dL

## 2015-11-26 MED ORDER — BISACODYL 10 MG RE SUPP
10.0000 mg | Freq: Once | RECTAL | Status: AC
  Administered 2015-11-26: 10 mg via RECTAL
  Filled 2015-11-26: qty 1

## 2015-11-26 MED ORDER — TRAMADOL HCL 50 MG PO TABS
50.0000 mg | ORAL_TABLET | Freq: Four times a day (QID) | ORAL | Status: DC | PRN
  Administered 2015-11-26 – 2015-11-27 (×3): 50 mg via ORAL
  Filled 2015-11-26 (×3): qty 1

## 2015-11-26 NOTE — Anesthesia Postprocedure Evaluation (Signed)
Anesthesia Post Note  Patient: Louis Fox  Procedure(s) Performed: Procedure(s) (LRB): ROBOTIC ASSISTED LAPAROSCOPIC  PARTIAL NEPHRECTOMY (Right)  Patient location during evaluation: PACU Anesthesia Type: General Level of consciousness: awake and alert Pain management: pain level controlled Vital Signs Assessment: post-procedure vital signs reviewed and stable Respiratory status: spontaneous breathing, nonlabored ventilation, respiratory function stable and patient connected to nasal cannula oxygen Cardiovascular status: blood pressure returned to baseline and stable Postop Assessment: no signs of nausea or vomiting Anesthetic complications: no    Last Vitals:  Filed Vitals:   11/26/15 0230 11/26/15 0449  BP: 130/65 101/55  Pulse: 65 71  Temp: 36.7 C 37.3 C  Resp: 14 12    Last Pain:  Filed Vitals:   11/26/15 0729  PainSc: 2                  Taressa Rauh J

## 2015-11-26 NOTE — Progress Notes (Signed)
Patient ID: Louis Fox, male   DOB: 28-Apr-1956, 59 y.o.   MRN: [id number]  1 Day Post-Op Subjective: Pt doing well.  Complains of moderate right shoulder pain associated with certain positioning of his right upper extremity. No numbness or weakness of his arm.  No nausea or vomiting. Pain controlled.  Objective: Vital signs in last 24 hours: Temp:  [97.4 F (36.3 C)-99.1 F (37.3 C)] 99.1 F (37.3 C) (11/29 0449) Pulse Rate:  [62-80] 71 (11/29 0449) Resp:  [11-16] 12 (11/29 0449) BP: (101-168)/(55-97) 101/55 mmHg (11/29 0449) SpO2:  [99 %-100 %] 100 % (11/29 0449) Weight:  [71.215 kg (157 lb)] 71.215 kg (157 lb) (11/28 0935)  Intake/Output from previous day: 11/28 0701 - 11/29 0700 In: 3772.5 [P.O.:360; I.V.:3262.5; IV Piggyback:150] Out: 2065 [Urine:1850; Drains:115; Blood:100] Intake/Output this shift: Total I/O In: 1472.5 [P.O.:360; I.V.:962.5; IV Piggyback:150] Out: [id number] [Urine:1625; Drains:90]  Physical Exam:  General: Alert and oriented CV: RRR Lungs: Clear Abdomen: Soft, ND Incisions: C/D/I Ext: NT, No erythema, No neurologic deficits of right upper extremity  Lab Results:  Recent Labs  11/25/15 1520 11/26/15 0507  HGB 13.3 11.8*  HCT 38.8* 34.4*   BMET  Recent Labs  11/25/15 1520 11/26/15 0507  NA 138 136  K 4.5 4.0  CL 103 105  CO2 28 27  GLUCOSE 156* 132*  BUN 10 8  CREATININE 0.88 0.80  CALCIUM 9.2 8.9     Studies/Results: No results found.  Assessment/Plan: POD # 1 s/p right RAL partial nephrectomy - Ambulate, IS - Advance diet as tolerated - Transition to po pain meds - D/C Foley - Monitor renal function   LOS: 1 day   Aristeo Hankerson,LES 11/26/2015, 6:43 AM

## 2015-11-26 NOTE — Care Management Note (Signed)
Case Management Note  Patient Details  Name: Louis Fox MRN: [id number] Date of Birth: 1956/07/01  Subjective/Objective:59 y/o m admitted w/R renal mass. POD#1 Partial nephrectomy. From home.                    Action/Plan:d/c plan home.   Expected Discharge Date:                 Expected Discharge Plan:  Home/Self Care  In-House Referral:     Discharge planning Services  CM Consult  Post Acute Care Choice:    Choice offered to:     DME Arranged:    DME Agency:     HH Arranged:    HH Agency:     Status of Service:  In process, will continue to follow  Medicare Important Message Given:    Date Medicare IM Given:    Medicare IM give by:    Date Additional Medicare IM Given:    Additional Medicare Important Message give by:     If discussed at [place] of Stay Meetings, dates discussed:    Additional Comments:  Dessa Phi, RN 11/26/2015, 1:21 PM

## 2015-11-26 NOTE — Progress Notes (Signed)
Patient ID: Louis Fox, male   DOB: [birth year], 59 y.o.   MRN: [id number]  1 Day Post-Op PM  Subjective: Pt doing well.  Reports that right shoulder pain is somewhat improved with walking. No numbness or weakness of his arm.  No nausea or vomiting. Pain well controlled. Ambulating without difficulty. No flatus or BM after suppository today. Tolerating regular food. Voided 1.0 L with PVR of 300 mL.  Objective: Vital signs in last 24 hours: Temp:  [97.4 F (36.3 C)-99.1 F (37.3 C)] 99.1 F (37.3 C) (11/29 0449) Pulse Rate:  [62-80] 71 (11/29 0449) Resp:  [11-16] 12 (11/29 0449) BP: (101-168)/(55-97) 101/55 mmHg (11/29 0449) SpO2:  [99 %-100 %] 100 % (11/29 0449)  Intake/Output from previous day: 11/28 0701 - 11/29 0700 In: 5222.5 [P.O.:360; I.V.:4462.5; IV Piggyback:400] Out: 2675 [Urine:2450; Drains:125; Blood:100] Intake/Output this shift: Total I/O In: -  Out: [id number] [Urine:1734; Drains:20]  Physical Exam:  General: Alert and oriented CV: RRR Lungs: Clear Abdomen: Soft, ND. JP drain with SS output. Incisions: C/D/I Ext: NT, No erythema, No neurologic deficits of right upper extremity  Lab Results:  Recent Labs  11/25/15 1520 11/26/15 0507  HGB 13.3 11.8*  HCT 38.8* 34.4*   BMET  Recent Labs  11/25/15 1520 11/26/15 0507  NA 138 136  K 4.5 4.0  CL 103 105  CO2 28 27  GLUCOSE 156* 132*  BUN 10 8  CREATININE 0.88 0.80  CALCIUM 9.2 8.9     Studies/Results: No results found.  Assessment/Plan: POD # 1 s/p right RAL partial nephrectomy - Ambulate, IS - Regular diet - Medlocked - Transition to po pain meds - Foley out & voiding spontaneously - Repeat PVR after each void until PVR < 150 mL - Monitor renal function - Plan for D/C tomorrow   LOS: 1 day   Acie Fredrickson 11/26/2015, 12:54 PM

## 2015-11-27 LAB — HEMOGLOBIN AND HEMATOCRIT, BLOOD
HCT: 35.4 % — ABNORMAL LOW (ref 39.0–52.0)
Hemoglobin: 11.9 g/dL — ABNORMAL LOW (ref 13.0–17.0)

## 2015-11-27 LAB — BASIC METABOLIC PANEL
Anion gap: 8 (ref 5–15)
BUN: 11 mg/dL (ref 6–20)
CO2: 27 mmol/L (ref 22–32)
Calcium: 8.9 mg/dL (ref 8.9–10.3)
Chloride: 101 mmol/L (ref 101–111)
Creatinine, Ser: 0.84 mg/dL (ref 0.61–1.24)
GFR calc Af Amer: >60 mL/min (ref >60)
GFR calc non Af Amer: >60 mL/min (ref >60)
Glucose, Bld: 118 mg/dL — ABNORMAL HIGH (ref 65–99)
Potassium: 3.9 mmol/L (ref 3.5–5.1)
Sodium: 136 mmol/L (ref 135–145)

## 2015-11-27 MED ORDER — BISACODYL 10 MG RE SUPP
10.0000 mg | Freq: Once | RECTAL | Status: DC
  Filled 2015-11-27: qty 1

## 2015-11-27 MED ORDER — ALUM & MAG HYDROXIDE-SIMETH 200-200-20 MG/5ML PO SUSP: 15.0000 mL | Freq: Four times a day (QID) | ORAL | Status: DC | PRN

## 2015-11-27 NOTE — Discharge Summary (Signed)
Date of admission: 11/25/2015  Date of discharge: 11/27/2015  Admission diagnosis: Right renal neoplasm  Discharge diagnosis: Renal cell carcinoma  History and Physical: For full details, please see admission history and physical. Briefly, Louis Fox is a 59 y.o. year old patient with an Enhancing right renal mass concerning for renal malignancy. After reviewing options, he elected to undergo surgical resection with a right robotic-assisted laparoscopic partial nephrectomy.   Hospital Course: He was taken to the operating room on 11/25/15 and underwent a right robot-assisted laparoscopic partial nephrectomy without complications.  He was maintained on bed rest initially and remained hemodynamically stable.  On postoperative day 1, he was able to begin ambulating, advance his diet, and transition to oral pain medication.  His perinephric drain was monitored and a creatinine level was checked on the evening of postoperative day 1 and found to be consistent with serum.  His drain was able to be removed.  He was discharged home in stable condition on postoperative day 2.  Laboratory values:  Recent Labs  11/25/15 1520 11/26/15 0507 11/27/15 0426  HGB 13.3 11.8* 11.9*  HCT 38.8* 34.4* 35.4*    Recent Labs  11/26/15 0507 11/27/15 0426  CREATININE 0.80 0.84    Disposition: Home  Discharge instruction: The patient was instructed to be ambulatory but told to refrain from heavy lifting, strenuous activity, or driving.   Discharge medications:    Medication List    STOP taking these medications        b complex vitamins tablet     FLAX SEED OIL PO     meloxicam 15 MG tablet  Commonly known as:  MOBIC     multivitamin with minerals tablet     ULTRAM 50 MG tablet  Generic drug:  traMADol     vitamin C 500 MG tablet  Commonly known as:  ASCORBIC ACID     vitamin E 400 UNIT capsule      TAKE these medications        acetaminophen 500 MG tablet  Commonly known as:   TYLENOL  Take 1,000 mg by mouth daily as needed for mild pain.     HYDROcodone-acetaminophen 5-325 MG tablet  Commonly known as:  NORCO  Take 1-2 tablets by mouth every 6 (six) hours as needed.        Followup:      Follow-up Information    Follow up with Dutch Gray, MD On 12/17/2015.   Specialty:  Urology   Why:  at 10:30   Contact information:   [place] [address] [phone]

## 2015-11-27 NOTE — Progress Notes (Signed)
Patient ID: Louis Fox, male   DOB: 06/27/1956, 59 y.o.   MRN: [id number]  2 Days Post-Op Subjective: Pt doing well.  Tolerating diet.  Ambulating well.  Pain controlled.  No flatus yet.  Complains of reflux.  Objective: Vital signs in last 24 hours: Temp:  [98.1 F (36.7 C)] 98.1 F (36.7 C) (11/29 1407) Pulse Rate:  [68] 68 (11/29 1407) Resp:  [18] 18 (11/29 1407) BP: (107)/(66) 107/66 mmHg (11/29 1407) SpO2:  [98 %] 98 % (11/29 1407)  Intake/Output from previous day: 11/29 0701 - 11/30 0700 In: 360 [P.O.:360] Out: 3659 [Urine:3524; Drains:60; Stool:75] Intake/Output this shift: Total I/O In: -  Out: 600 [Urine:600]  Physical Exam:  General: Alert and oriented CV: RRR Lungs: Clear Abdomen: Soft, ND, NT, Positive BS Incisions: C/D/I Ext: NT, No erythema  Lab Results:  Recent Labs  11/25/15 1520 11/26/15 0507 11/27/15 0426  HGB 13.3 11.8* 11.9*  HCT 38.8* 34.4* 35.4*   BMET  Recent Labs  11/26/15 0507 11/27/15 0426  NA 136 136  K 4.0 3.9  CL 105 101  CO2 27 27  GLUCOSE 132* 118*  BUN 8 11  CREATININE 0.80 0.84  CALCIUM 8.9 8.9   Drain Cr 0.9   Studies/Results: Path: pT1a Nx Mx, 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

## 2015-11-27 NOTE — Care Management Note (Signed)
Case Management Note  Patient Details  Name: Louis Fox MRN: [id number] Date of Birth: [birth year]  Subjective/Objective:                    Action/Plan:d/c home no needs or orders.   Expected Discharge Date:                 Expected Discharge Plan:  Home/Self Care  In-House Referral:     Discharge planning Services  CM Consult  Post Acute Care Choice:    Choice offered to:     DME Arranged:    DME Agency:     HH Arranged:    [place] Agency:     Status of Service:  Completed, signed off  Medicare Important Message Given:    Date Medicare IM Given:    Medicare IM give by:    Date Additional Medicare IM Given:    Additional Medicare Important Message give by:     If discussed at [place] of Stay Meetings, dates discussed:    Additional Comments:  Dessa Phi, RN 11/27/2015, 10:03 AM

## 2016-06-26 ENCOUNTER — Ambulatory Visit (HOSPITAL_COMMUNITY)
Admission: RE | Admit: 2016-06-26 | Discharge: 2016-06-26 | Disposition: A | Discharge status: Home / Self Care | Payer: BLUE CROSS/BLUE SHIELD | Source: Ambulatory Visit | Attending: Urology | Admitting: Urology

## 2016-06-26 ENCOUNTER — Other Ambulatory Visit (HOSPITAL_COMMUNITY): Payer: Self-pay | Admitting: Urology

## 2016-06-26 DIAGNOSIS — C649 Malignant neoplasm of unspecified kidney, except renal pelvis: Secondary | ICD-10-CM | POA: Diagnosis not present

## 2016-06-26 DIAGNOSIS — C641 Malignant neoplasm of right kidney, except renal pelvis: Secondary | ICD-10-CM

## 2016-07-03 DIAGNOSIS — Z85528 Personal history of other malignant neoplasm of kidney: Secondary | ICD-10-CM | POA: Diagnosis not present

## 2017-01-27 DIAGNOSIS — C641 Malignant neoplasm of right kidney, except renal pelvis: Secondary | ICD-10-CM | POA: Diagnosis not present

## 2017-02-02 ENCOUNTER — Ambulatory Visit (HOSPITAL_COMMUNITY)
Admission: RE | Admit: 2017-02-02 | Discharge: 2017-02-02 | Disposition: A | Discharge status: Home / Self Care | Payer: BLUE CROSS/BLUE SHIELD | Source: Ambulatory Visit | Attending: Urology | Admitting: Urology

## 2017-02-02 ENCOUNTER — Other Ambulatory Visit (HOSPITAL_COMMUNITY): Payer: Self-pay | Admitting: Urology

## 2017-02-02 DIAGNOSIS — C641 Malignant neoplasm of right kidney, except renal pelvis: Secondary | ICD-10-CM | POA: Diagnosis not present

## 2017-02-02 DIAGNOSIS — Z85528 Personal history of other malignant neoplasm of kidney: Secondary | ICD-10-CM

## 2017-02-02 DIAGNOSIS — C649 Malignant neoplasm of unspecified kidney, except renal pelvis: Secondary | ICD-10-CM | POA: Diagnosis not present

## 2017-02-02 DIAGNOSIS — C7640 Malignant neoplasm of unspecified upper limb: Secondary | ICD-10-CM | POA: Diagnosis not present

## 2017-02-03 DIAGNOSIS — Z85528 Personal history of other malignant neoplasm of kidney: Secondary | ICD-10-CM | POA: Diagnosis not present

## 2017-05-19 ENCOUNTER — Ambulatory Visit (INDEPENDENT_AMBULATORY_CARE_PROVIDER_SITE_OTHER): Payer: BLUE CROSS/BLUE SHIELD | Admitting: Family Medicine

## 2017-05-19 ENCOUNTER — Encounter: Payer: Self-pay | Admitting: Family Medicine

## 2017-05-19 VITALS — BP 121/78 | HR 62 | Temp 98.7°F | Resp 16 | Ht 68.0 in | Wt 150.4 lb

## 2017-05-19 DIAGNOSIS — R7989 Other specified abnormal findings of blood chemistry: Secondary | ICD-10-CM | POA: Diagnosis not present

## 2017-05-19 DIAGNOSIS — Z0000 Encounter for general adult medical examination without abnormal findings: Secondary | ICD-10-CM

## 2017-05-19 DIAGNOSIS — Z1159 Encounter for screening for other viral diseases: Secondary | ICD-10-CM | POA: Diagnosis not present

## 2017-05-19 LAB — LIPID PANEL
Cholesterol: 193 mg/dL (ref 0–200)
HDL: 38.3 mg/dL — ABNORMAL LOW (ref >39.00)
NonHDL: 154.45
Total CHOL/HDL Ratio: 5
Triglycerides: 220 mg/dL — ABNORMAL HIGH (ref 0.0–149.0)
VLDL: 44 mg/dL — ABNORMAL HIGH (ref 0.0–40.0)

## 2017-05-19 LAB — BASIC METABOLIC PANEL
BUN: 20 mg/dL (ref 6–23)
CO2: 27 mEq/L (ref 19–32)
Calcium: 9.6 mg/dL (ref 8.4–10.5)
Chloride: 105 mEq/L (ref 96–112)
Creatinine, Ser: 0.8 mg/dL (ref 0.40–1.50)
GFR: 103.98 mL/min (ref >60.00)
Glucose, Bld: 91 mg/dL (ref 70–99)
Potassium: 4.1 mEq/L (ref 3.5–5.1)
Sodium: 139 mEq/L (ref 135–145)

## 2017-05-19 LAB — CBC WITH DIFFERENTIAL/PLATELET
Basophils Absolute: 0.1 10*3/uL (ref 0.0–0.1)
Basophils Relative: 0.9 % (ref 0.0–3.0)
Eosinophils Absolute: 0.1 10*3/uL (ref 0.0–0.7)
Eosinophils Relative: 1.8 % (ref 0.0–5.0)
HCT: 42.5 % (ref 39.0–52.0)
Hemoglobin: 14.8 g/dL (ref 13.0–17.0)
Lymphocytes Relative: 22.4 % (ref 12.0–46.0)
Lymphs Abs: 1.7 10*3/uL (ref 0.7–4.0)
MCHC: 34.7 g/dL (ref 30.0–36.0)
MCV: 88.1 fl (ref 78.0–100.0)
Monocytes Absolute: 0.5 10*3/uL (ref 0.1–1.0)
Monocytes Relative: 6 % (ref 3.0–12.0)
Neutro Abs: 5.1 10*3/uL (ref 1.4–7.7)
Neutrophils Relative %: 68.9 % (ref 43.0–77.0)
Platelets: 269 10*3/uL (ref 150.0–400.0)
RBC: 4.83 Mil/uL (ref 4.22–5.81)
RDW: 12.8 % (ref 11.5–15.5)
WBC: 7.5 10*3/uL (ref 4.0–10.5)

## 2017-05-19 LAB — LDL CHOLESTEROL, DIRECT: Direct LDL: 104 mg/dL

## 2017-05-19 LAB — HEPATIC FUNCTION PANEL
ALT: 10 U/L (ref 0–53)
AST: 15 U/L (ref 0–37)
Albumin: 4.7 g/dL (ref 3.5–5.2)
Alkaline Phosphatase: 54 U/L (ref 39–117)
Bilirubin, Direct: 0.1 mg/dL (ref 0.0–0.3)
Total Bilirubin: 0.5 mg/dL (ref 0.2–1.2)
Total Protein: 6.6 g/dL (ref 6.0–8.3)

## 2017-05-19 LAB — PSA: PSA: 1.52 ng/mL (ref 0.10–4.00)

## 2017-05-19 NOTE — Assessment & Plan Note (Signed)
Pt's PE WNL.  Pt is unsure of date of last colonoscopy and Tdap- will await records prior to updating.  Check labs.  Anticipatory guidance provided.

## 2017-05-19 NOTE — Progress Notes (Signed)
   Subjective:    Patient ID: Louis Fox, male    DOB: 08/26/1956, 61 y.o.   MRN: [id number]  HPI New to establish.  Previous MD- [facility]  Urology- [place]  CPE- pt is not clear on when last colonoscopy was (will get records).  Hx of kidney cancer- s/p L nephrectomy w/ Dr Alinda Money.  Continues to follow regularly.   Review of Systems Patient reports no vision/hearing changes, anorexia, fever ,adenopathy, persistant/recurrent hoarseness, swallowing issues, chest pain, palpitations, edema, persistant/recurrent cough, hemoptysis, dyspnea (rest,exertional, paroxysmal nocturnal), gastrointestinal  bleeding (melena, rectal bleeding), abdominal pain, excessive heart burn, GU symptoms (dysuria, hematuria, voiding/incontinence issues) syncope, focal weakness, memory loss, numbness & tingling, skin/hair/nail changes, depression, anxiety, abnormal bruising/bleeding, musculoskeletal symptoms/signs.     Objective:   Physical Exam General Appearance:    Alert, cooperative, no distress, appears stated age  Head:    Normocephalic, without obvious abnormality, atraumatic  Eyes:    PERRL, conjunctiva/corneas clear, EOM's intact, fundi    benign, both eyes       Ears:    Normal TM's and external ear canals, both ears  Nose:   Nares normal, septum midline, mucosa normal, no drainage   or sinus tenderness  Throat:   Lips, mucosa, and tongue normal; teeth and gums normal  Neck:   Supple, symmetrical, trachea midline, no adenopathy;       thyroid:  No enlargement/tenderness/nodules  Back:     Symmetric, no curvature, ROM normal, no CVA tenderness  Lungs:     Clear to auscultation bilaterally, respirations unlabored  Chest wall:    No tenderness or deformity  Heart:    Regular rate and rhythm, S1 and S2 normal, no murmur, rub   or gallop  Abdomen:     Soft, non-tender, bowel sounds active all four quadrants,    no masses, no organomegaly  Genitalia:    Deferred at pt's  request  Rectal:    Extremities:   Extremities normal, atraumatic, no cyanosis or edema  Pulses:   2+ and symmetric all extremities  Skin:   Skin color, texture, turgor normal, no rashes or lesions  Lymph nodes:   Cervical, supraclavicular, and axillary nodes normal  Neurologic:   CNII-XII intact. Normal strength, sensation and reflexes      throughout          Assessment & Plan:

## 2017-05-19 NOTE — Patient Instructions (Signed)
Follow up in 1 year or as needed We'll notify you of your lab results and make any changes if needed Continue to work on healthy diet and regular exercise- you look great! Call with any questions or concerns Welcome!  We're glad to have you!  

## 2017-05-19 NOTE — Progress Notes (Signed)
Pre visit review using our clinic review tool, if applicable. No additional management support is needed unless otherwise documented below in the visit note. 

## 2017-05-20 LAB — HEPATITIS C ANTIBODY: HCV Ab: NEGATIVE

## 2017-05-25 ENCOUNTER — Encounter: Payer: Self-pay | Admitting: General Practice

## 2017-06-13 IMAGING — CR DG CHEST 2V
2 series · 2 of 2 positions shown · non-contrast
Comparison: October 03, 2015.

CLINICAL DATA: Preop right partial nephrectomy.

EXAM:
CHEST  2 VIEW

[w chest pa]
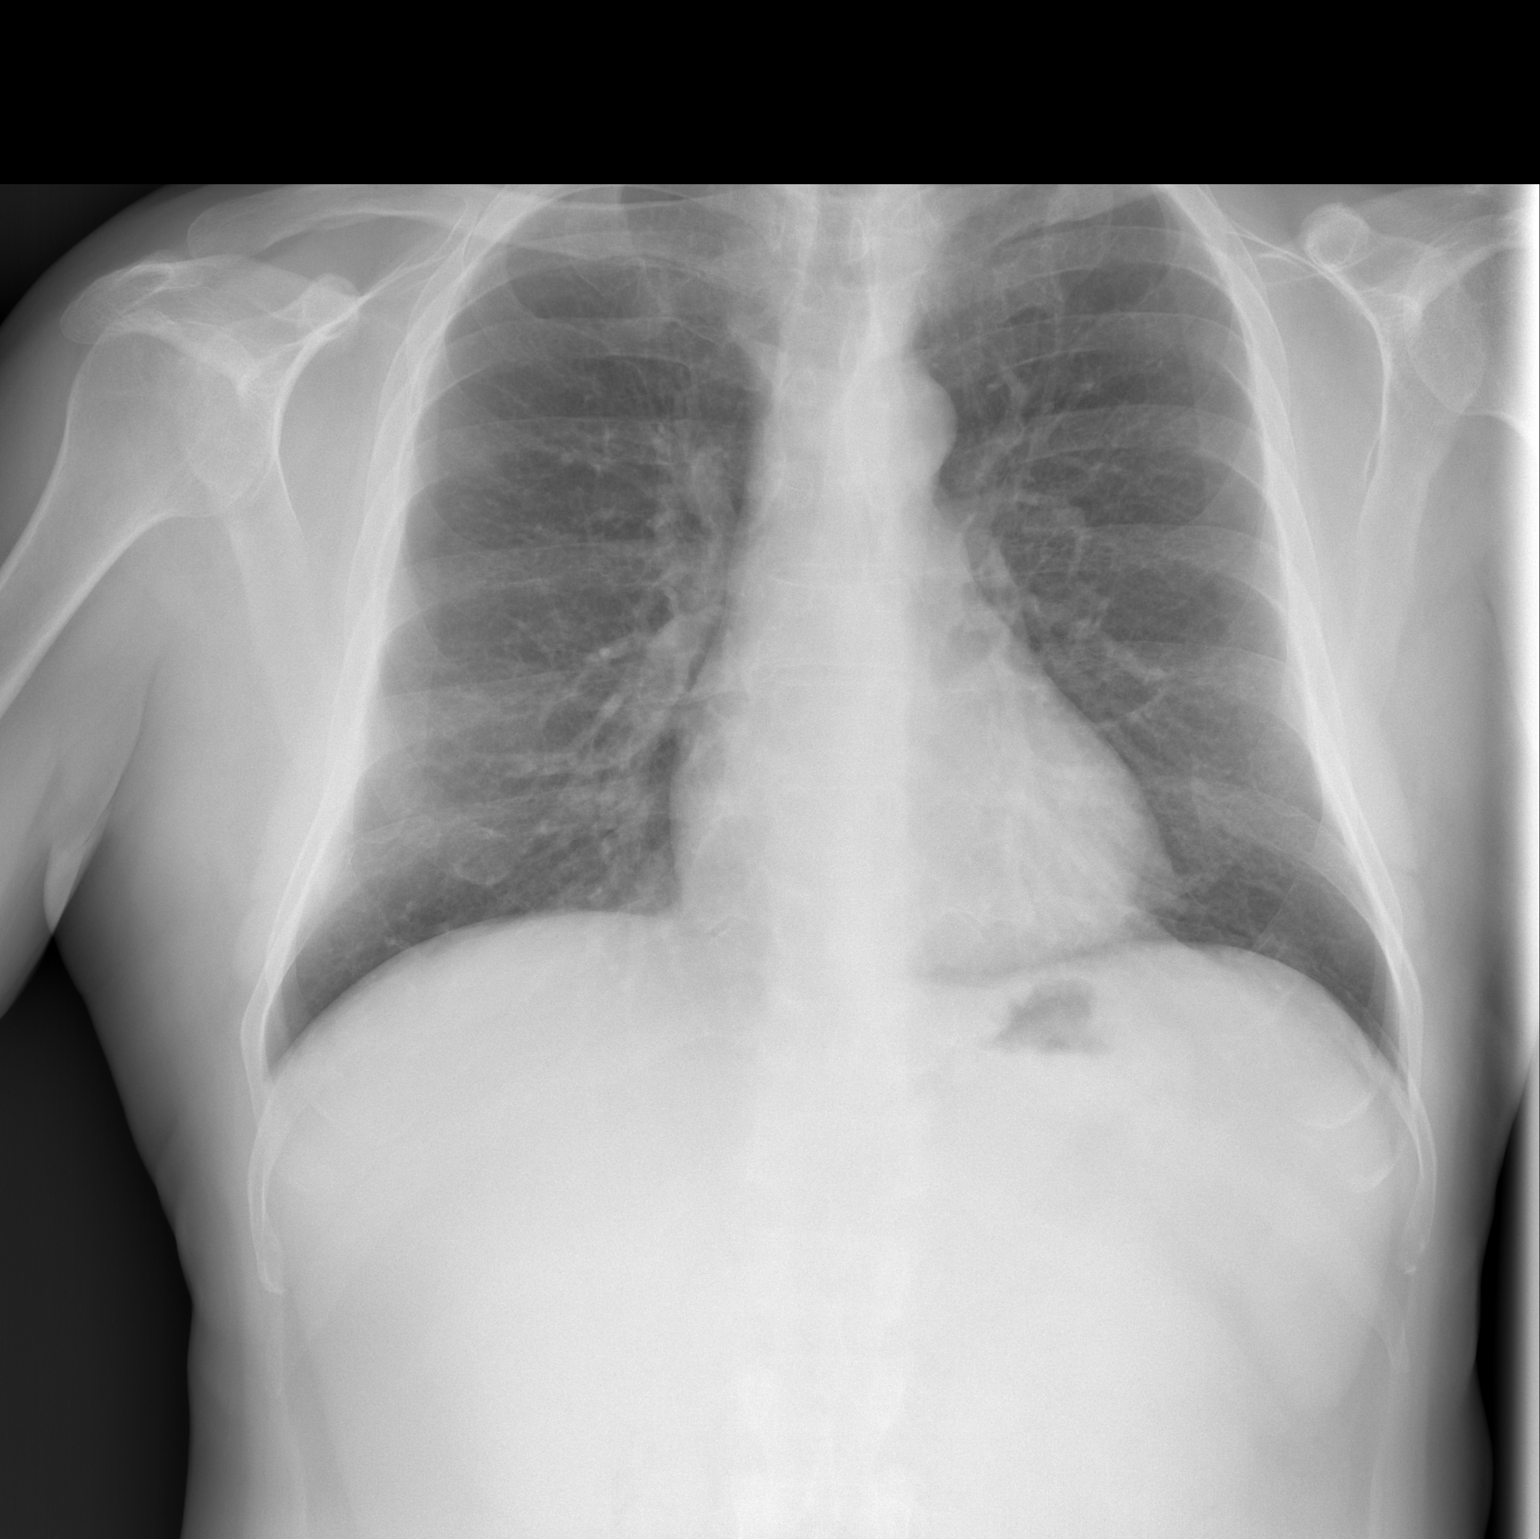

[w chest lat]
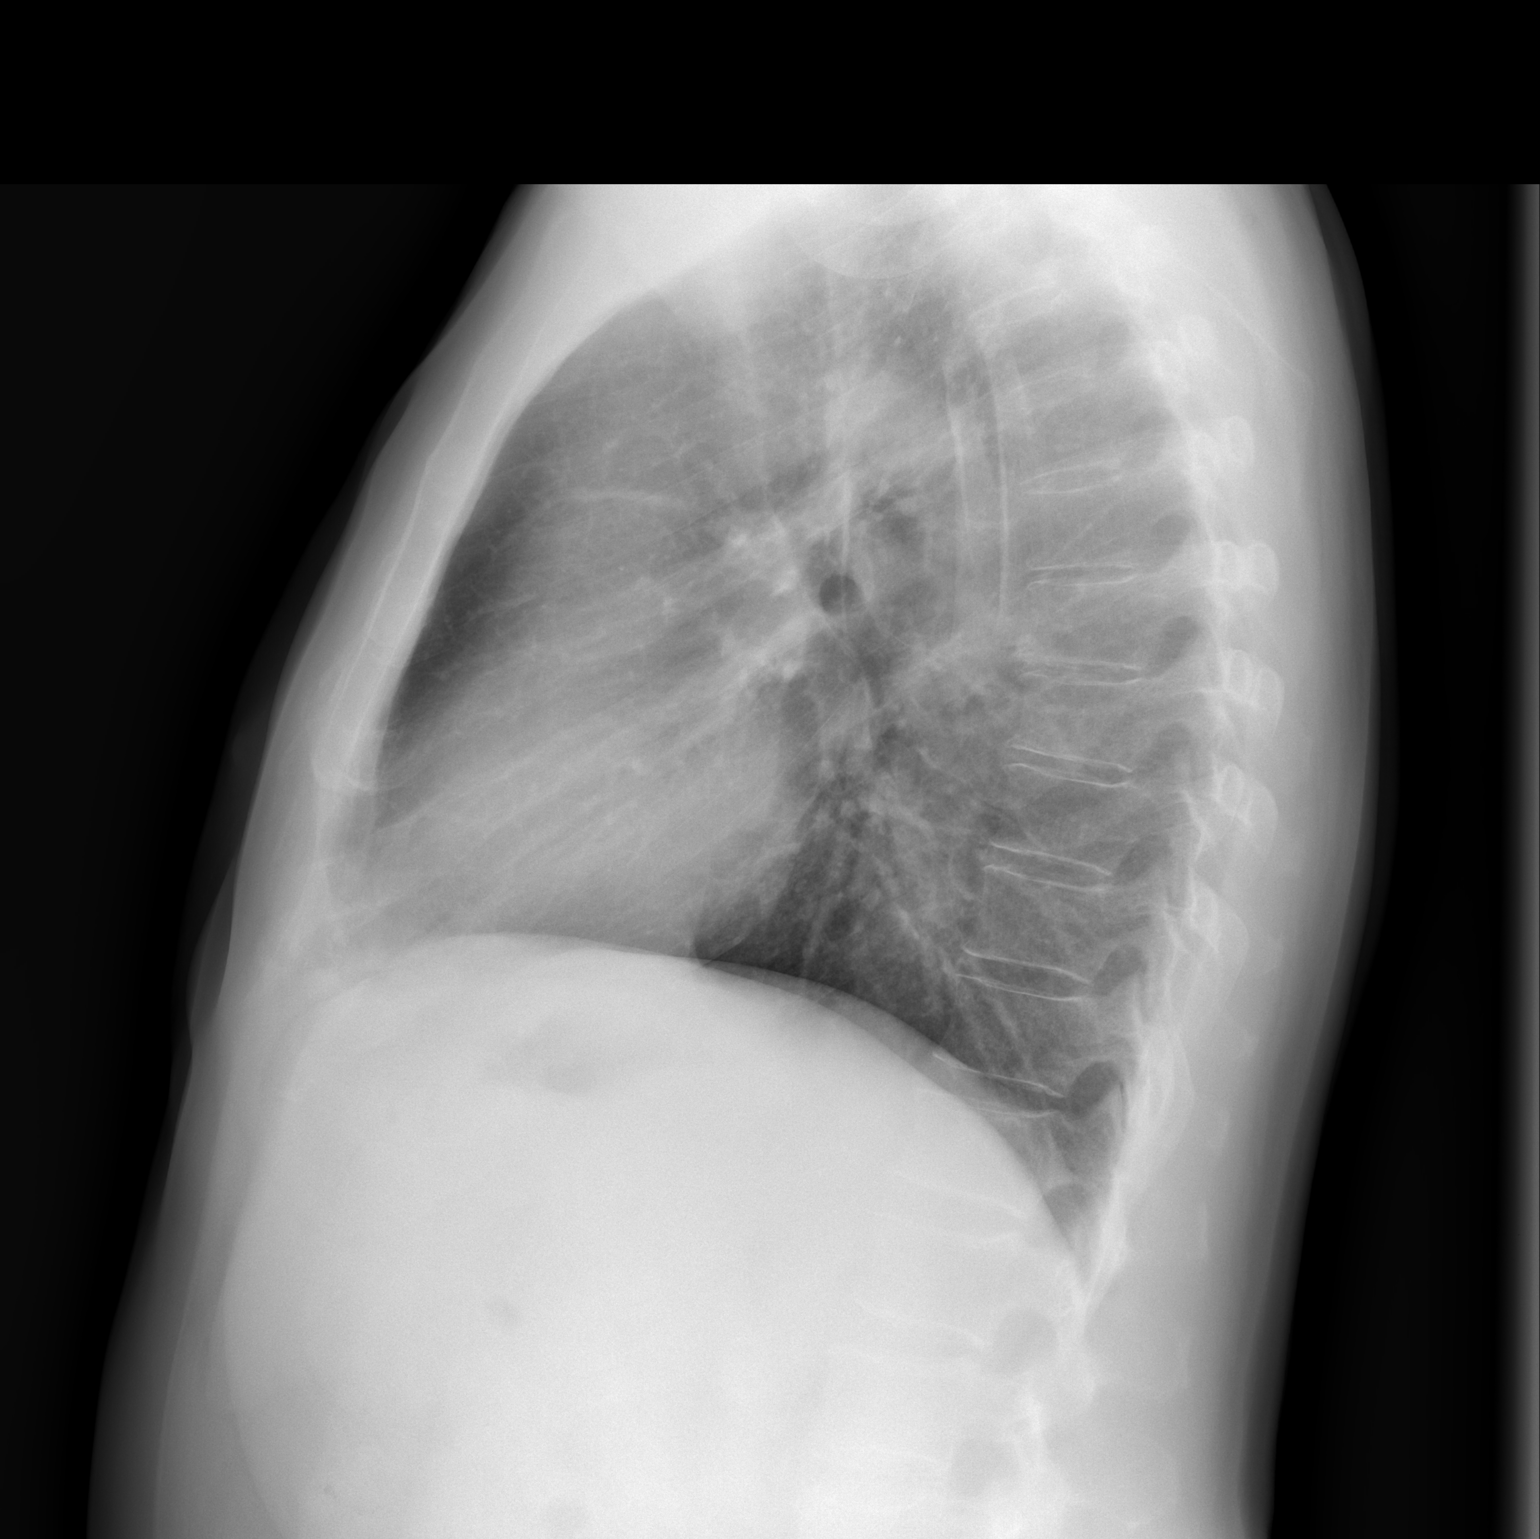

[2 of 2 positions shown; findings below may reference images not displayed]

FINDINGS: The heart size and mediastinal contours are within normal limits.
Both lungs are clear. The visualized skeletal structures are
unremarkable.
IMPRESSION: No active cardiopulmonary disease.

## 2017-08-25 ENCOUNTER — Ambulatory Visit (HOSPITAL_COMMUNITY)
Admission: RE | Admit: 2017-08-25 | Discharge: 2017-08-25 | Disposition: A | Discharge status: Home / Self Care | Payer: BLUE CROSS/BLUE SHIELD | Source: Ambulatory Visit | Attending: Urology | Admitting: Urology

## 2017-08-25 ENCOUNTER — Other Ambulatory Visit (HOSPITAL_COMMUNITY): Payer: Self-pay | Admitting: Urology

## 2017-08-25 DIAGNOSIS — Z905 Acquired absence of kidney: Secondary | ICD-10-CM | POA: Diagnosis not present

## 2017-08-25 DIAGNOSIS — Z85528 Personal history of other malignant neoplasm of kidney: Secondary | ICD-10-CM | POA: Diagnosis not present

## 2017-09-01 DIAGNOSIS — Z85528 Personal history of other malignant neoplasm of kidney: Secondary | ICD-10-CM | POA: Diagnosis not present

## 2017-09-01 DIAGNOSIS — K432 Incisional hernia without obstruction or gangrene: Secondary | ICD-10-CM | POA: Diagnosis not present

## 2017-09-20 ENCOUNTER — Ambulatory Visit: Payer: Self-pay | Admitting: General Surgery

## 2017-09-20 DIAGNOSIS — K432 Incisional hernia without obstruction or gangrene: Secondary | ICD-10-CM | POA: Diagnosis not present

## 2017-09-20 NOTE — H&P (Signed)
History of Present Illness Louis Fox; 09/20/2017 2:55 PM) The patient is a 61 year old male who presents with an incisional hernia. Referred by: Dr. Romilda Joy Borden/Dr. Annye Asa Chief Complaint: Incisional hernia  Patient is a 61 year old male who previously has had a partial nephrectomy secondary to a right renal mass in 2016. Patient states that several months thereafter he did have a cold had associated coughing and sneezing. He states he noticed a bulge at his midline upper incision. He states that since that time areas gotten somewhat larger. He states that he does have some tenderness at times. There is no inciting or relieving factors. Patient has not had any previous surgeries aside from that listed.  Patient does have a history of pancreatitis which appeared to be all correlated. 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    Past Surgical History (Louis Fox, [place]; 09/20/2017 2:42 PM) Nephrectomy  Right. Ventral / Umbilical Hernia Surgery  Bilateral.  Diagnostic Studies History (Louis Fox, [place]; 09/20/2017 2:42 PM) Colonoscopy  5-10 years ago  Allergies (Louis Fox, [place]; 09/20/2017 2:43 PM) No Known Drug Allergies 09/20/2017 Allergies Reconciled   Medication History (Louis Fox, [place]; 09/20/2017 2:43 PM) No Current Medications Medications Reconciled  Social History (Louis Fox, [place]; 09/20/2017 2:42 PM) Alcohol use  Remotely quit alcohol use. Caffeine use  Coffee, Tea. No drug use  Tobacco use  Never smoker.  Family History (Louis Fox, RMA; 09/20/2017 2:42 PM) Cancer  Brother. Heart disease in male family member before age 28  Prostate Cancer  Brother.  Other Problems (Louis Fox, [place]; 09/20/2017 2:42 PM) Cancer  Other disease, cancer, significant illness  Umbilical Hernia Repair     Review of Systems Louis Fox; 09/20/2017 2:53 PM) General Not Present- Appetite Loss, Chills,  Fatigue, Fever, Night Sweats, Weight Gain and Weight Loss. Skin Not Present- Change in Wart/Mole, Dryness, Hives, Jaundice, New Lesions, Non-Healing Wounds, Rash and Ulcer. HEENT Not Present- Earache, Hearing Loss, Hoarseness, Nose Bleed, Oral Ulcers, Ringing in the Ears, Seasonal Allergies, Sinus Pain, Sore Throat, Visual Disturbances, Wears glasses/contact lenses and Yellow Eyes. Respiratory Not Present- Bloody sputum, Chronic Cough, Difficulty Breathing, Snoring and Wheezing. Breast Not Present- Breast Mass, Breast Pain, Nipple Discharge and Skin Changes. Cardiovascular Not Present- Chest Pain, Difficulty Breathing Lying Down, Leg Cramps, Palpitations, Rapid Heart Rate, Shortness of Breath and Swelling of Extremities. Gastrointestinal Present- Abdominal Pain. Not Present- Bloating, Bloody Stool, Change in Bowel Habits, Chronic diarrhea, Constipation, Difficulty Swallowing, Excessive gas, Gets full quickly at meals, Hemorrhoids, Indigestion, Nausea, Rectal Pain and Vomiting. Male Genitourinary Not Present- Blood in Urine, Change in Urinary Stream, Frequency, Impotence, Nocturia, Painful Urination, Urgency and Urine Leakage. Musculoskeletal Not Present- Back Pain, Joint Pain, Joint Stiffness, Muscle Pain, Muscle Weakness and Swelling of Extremities. Neurological Not Present- Decreased Memory, Fainting, Headaches, Numbness, Seizures, Tingling, Tremor, Trouble walking and Weakness. Psychiatric Not Present- Anxiety, Bipolar, Change in Sleep Pattern, Depression, Fearful and Frequent crying. Endocrine Not Present- Cold Intolerance, Excessive Hunger, Hair Changes, Heat Intolerance, Hot flashes and New Diabetes. Hematology Not Present- Blood Thinners, Easy Bruising, Excessive bleeding, Gland problems, HIV and Persistent Infections. All other systems negative  Vitals (Louis A. Brown RMA; 09/20/2017 2:43 PM) 09/20/2017 2:43 PM Weight: 154.4 lb Height: 66in Body Surface Area: 1.79 m Body Mass Index:  24.92 kg/m  Temp.: 98.66F  Pulse: 69 (Regular)  P.OX: 98% (Room air) BP: 120/68 (Sitting, Left Arm, Standard)       Physical Exam Louis Fox Louis Gros  Fox; 09/20/2017 2:56 PM) The physical exam findings are as follows: Note:Constitutional: No acute distress, conversant, appears stated age  Eyes: Anicteric sclerae, moist conjunctiva, no lid lag  Neck: No thyromegaly, trachea midline, no cervical lymphadenopathy  Lungs: Clear to auscultation biilaterally, normal respiratory effot  Cardiovascular: regular rate & rhythm, no murmurs, no peripheal edema, pedal pulses 2+  GI: Soft, no masses or hepatosplenomegaly, non-tender to palpation  MSK: Normal gait, no clubbing cyanosis, edema  Skin: No rashes, palpation reveals normal skin turgor  Psychiatric: Appropriate judgment and insight, oriented to person, place, and time  Abdomen Inspection Hernias - Incisional - Reducible(Upper midline incisional hernia, small approximate 1.5 cm's, reducible).    Assessment & Plan Louis Fox; 09/20/2017 2:56 PM) INCISIONAL HERNIA, WITHOUT OBSTRUCTION OR GANGRENE (K43.2) Impression: 61 year old male with a incisional hernia status post robotic right partial nephrectomy for right renal mass. 1. The patient will like to proceed to the operating room for laparoscopic incisional hernia repair with mesh.  2. I discussed with the patient the signs and symptoms of incarceration and strangulation and the need to proceed to the ER should they occur.  3. I discussed with the patient the risks and benefits of the procedure to include but not limited to: Infection, bleeding, damage to surrounding structures, possible need for further surgery, possible nerve pain, and possible recurrence. The patient was understanding and wishes to proceed.

## 2017-11-23 DIAGNOSIS — K432 Incisional hernia without obstruction or gangrene: Secondary | ICD-10-CM | POA: Diagnosis not present

## 2018-02-09 IMAGING — CR DG CHEST 2V
2 series · 2 of 2 positions shown · non-contrast
Comparison: 10/29/2015

CLINICAL DATA: Renal cell carcinoma.

EXAM:
CHEST  2 VIEW

[w chest pa]
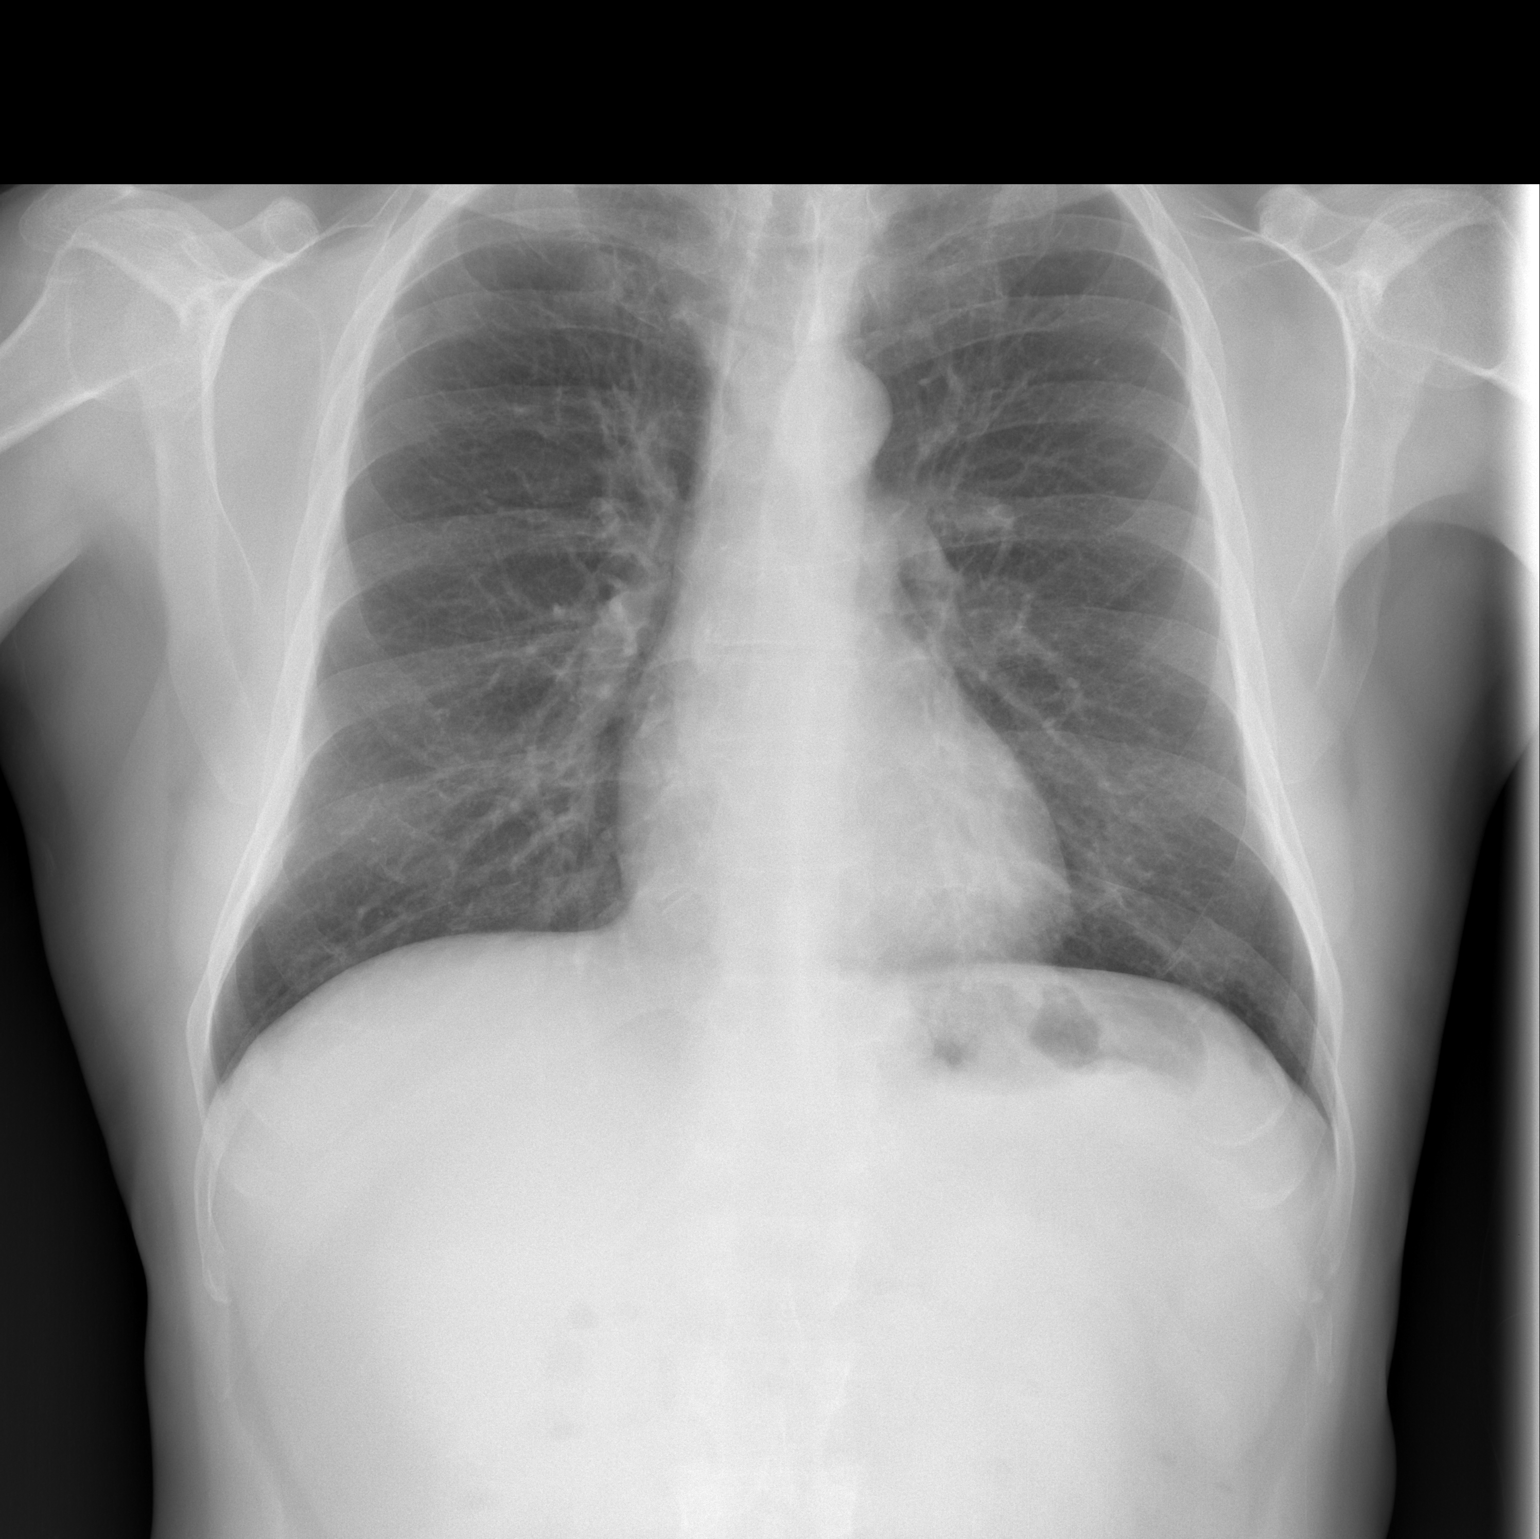

[w chest lat]
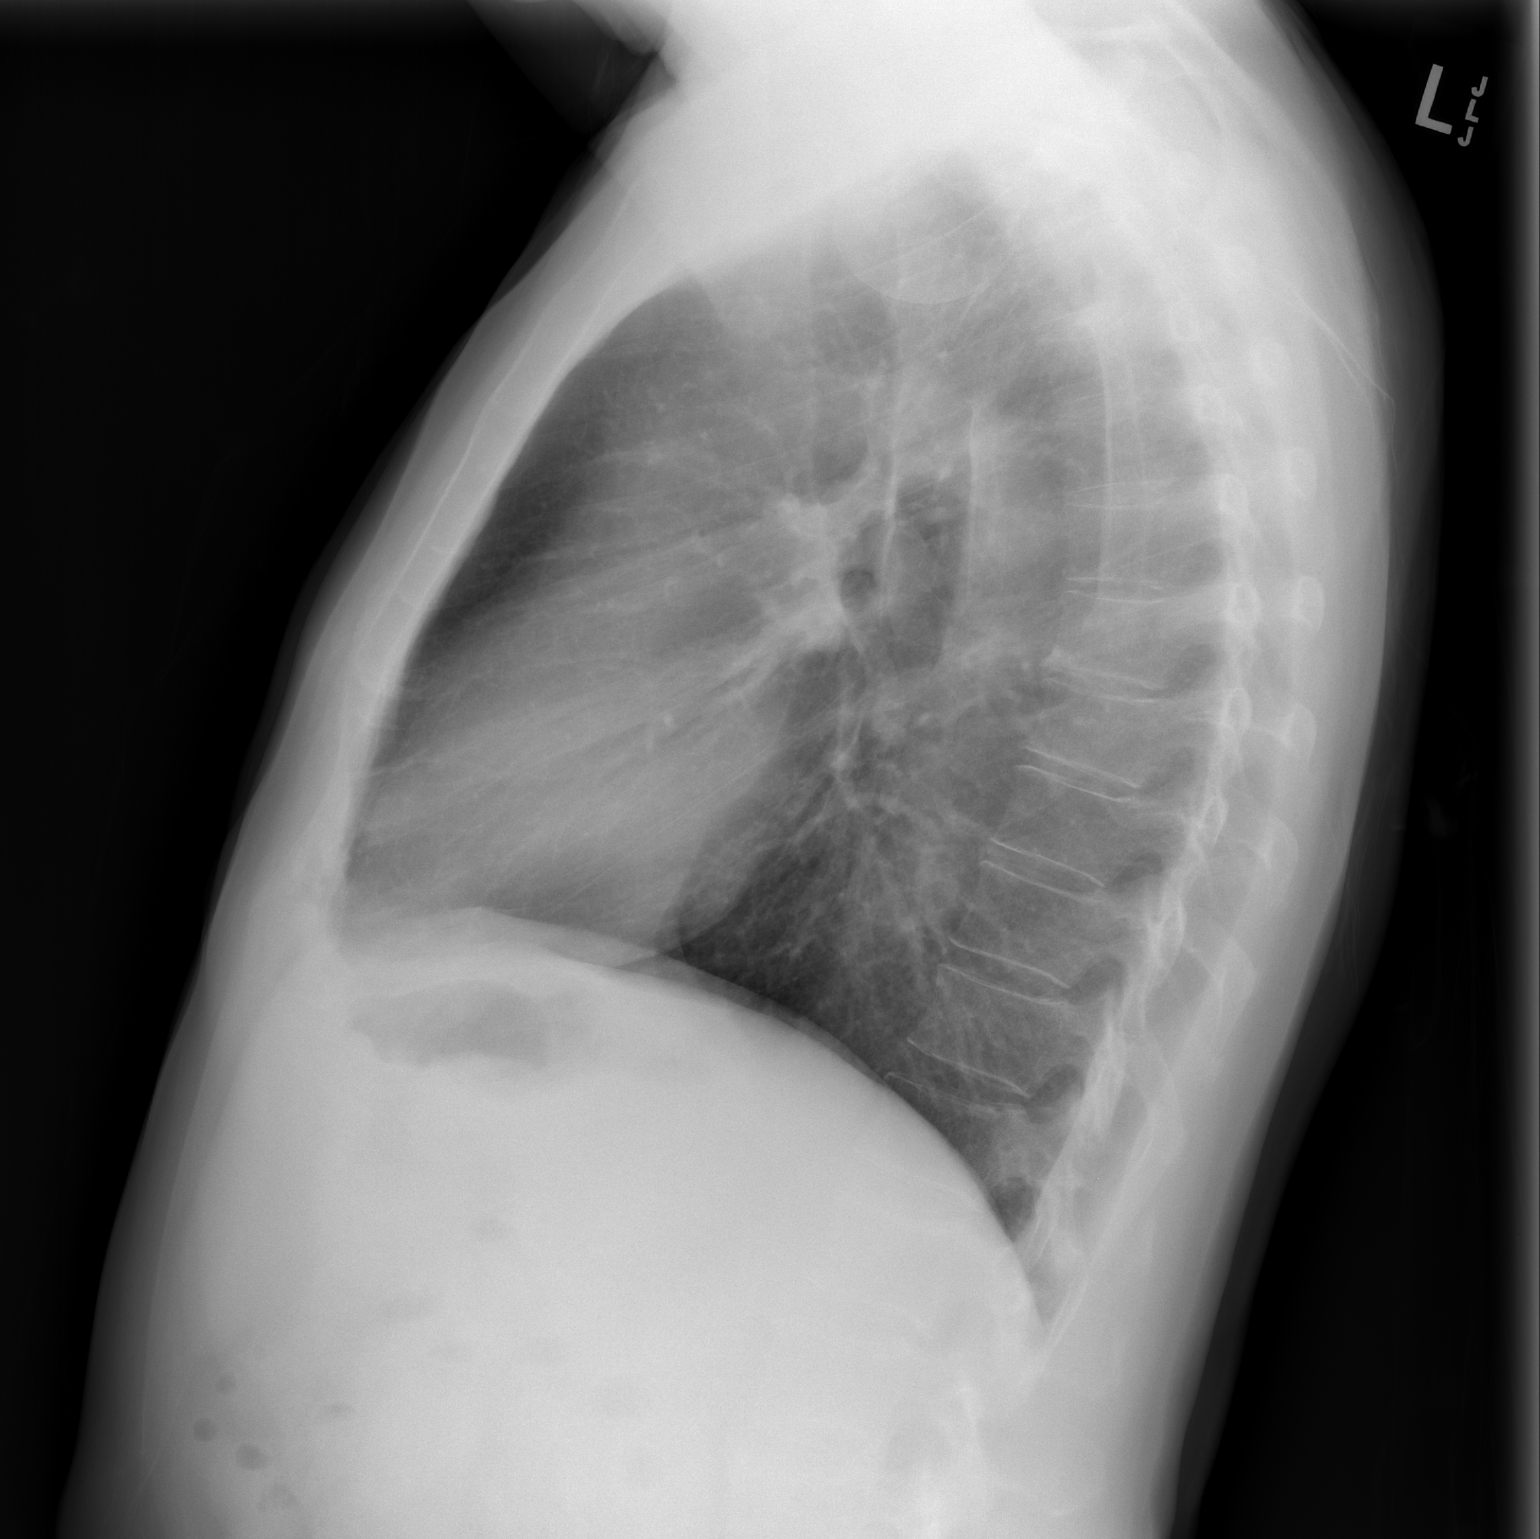

[2 of 2 positions shown; findings below may reference images not displayed]

FINDINGS: Heart and mediastinal contours are within normal limits. No focal
opacities or effusions. No acute bony abnormality.
IMPRESSION: No active cardiopulmonary disease.

## 2018-03-02 DIAGNOSIS — Z85528 Personal history of other malignant neoplasm of kidney: Secondary | ICD-10-CM | POA: Diagnosis not present

## 2018-03-07 ENCOUNTER — Other Ambulatory Visit (HOSPITAL_COMMUNITY): Payer: Self-pay | Admitting: Urology

## 2018-03-07 ENCOUNTER — Ambulatory Visit (HOSPITAL_COMMUNITY)
Admission: RE | Admit: 2018-03-07 | Discharge: 2018-03-07 | Disposition: A | Discharge status: Home / Self Care | Payer: BLUE CROSS/BLUE SHIELD | Source: Ambulatory Visit | Attending: Urology | Admitting: Urology

## 2018-03-07 DIAGNOSIS — C649 Malignant neoplasm of unspecified kidney, except renal pelvis: Secondary | ICD-10-CM | POA: Diagnosis not present

## 2018-03-07 DIAGNOSIS — Z85528 Personal history of other malignant neoplasm of kidney: Secondary | ICD-10-CM

## 2018-03-11 DIAGNOSIS — Z85528 Personal history of other malignant neoplasm of kidney: Secondary | ICD-10-CM | POA: Diagnosis not present

## 2018-03-30 DIAGNOSIS — Z1211 Encounter for screening for malignant neoplasm of colon: Secondary | ICD-10-CM | POA: Diagnosis not present

## 2018-05-05 ENCOUNTER — Encounter: Payer: Self-pay | Admitting: Family Medicine

## 2018-05-05 DIAGNOSIS — Z1211 Encounter for screening for malignant neoplasm of colon: Secondary | ICD-10-CM | POA: Diagnosis not present

## 2018-05-05 DIAGNOSIS — D124 Benign neoplasm of descending colon: Secondary | ICD-10-CM | POA: Diagnosis not present

## 2018-05-05 DIAGNOSIS — K635 Polyp of colon: Secondary | ICD-10-CM | POA: Diagnosis not present

## 2018-05-09 LAB — HM COLONOSCOPY

## 2018-05-13 ENCOUNTER — Encounter: Payer: Self-pay | Admitting: General Practice

## 2018-05-17 ENCOUNTER — Encounter: Payer: BLUE CROSS/BLUE SHIELD | Admitting: Family Medicine

## 2018-07-08 ENCOUNTER — Encounter: Payer: Self-pay | Admitting: Family Medicine

## 2018-07-08 ENCOUNTER — Ambulatory Visit: Payer: BLUE CROSS/BLUE SHIELD | Admitting: Family Medicine

## 2018-07-08 ENCOUNTER — Other Ambulatory Visit: Payer: Self-pay

## 2018-07-08 VITALS — BP 124/80 | HR 66 | Temp 98.7°F | Resp 16 | Ht 68.0 in | Wt 150.5 lb

## 2018-07-08 DIAGNOSIS — Z85528 Personal history of other malignant neoplasm of kidney: Secondary | ICD-10-CM | POA: Insufficient documentation

## 2018-07-08 DIAGNOSIS — F419 Anxiety disorder, unspecified: Secondary | ICD-10-CM | POA: Diagnosis not present

## 2018-07-08 DIAGNOSIS — C641 Malignant neoplasm of right kidney, except renal pelvis: Secondary | ICD-10-CM

## 2018-07-08 MED ORDER — ESCITALOPRAM OXALATE 10 MG PO TABS
10.0000 mg | ORAL_TABLET | Freq: Every day | ORAL | 3 refills | Status: DC
Start: 2018-07-08 — End: 2018-08-10

## 2018-07-08 NOTE — Patient Instructions (Signed)
Follow up in 1 month to recheck mood START the Lexapro once daily- either morning or night I'm SO proud of you for going to counseling- that's a huge first step! Call with any questions or concerns Hang in there!

## 2018-07-08 NOTE — Progress Notes (Signed)
   Subjective:    Patient ID: Louis Fox, male    DOB: 15-Aug-1956, 62 y.o.   MRN: [id number]  HPI Anxiety- 'I think it's been going on for a long time but it's getting worse'.  Pt notes lack of focus, upset stomach, tightness in throat.  + strong family hx of depression/anxiety.  Wife triggers anxiety- they have started couples counseling.  Sleeping ok- will occasionally have difficulty shutting of his thoughts.  'I feel like I disappointed my wife'.  Pt finds himself wanting to be alone.  Pt reports energy level is typically good but will have some lethargic days.   Review of Systems For ROS see HPI     Objective:   Physical Exam  Constitutional: He is oriented to person, place, and time. He appears well-developed and well-nourished. No distress.  HENT:  Head: Normocephalic and atraumatic.  Eyes: Pupils are equal, round, and reactive to light. Conjunctivae and EOM are normal.  Neck: Normal range of motion. Neck supple. No thyromegaly present.  Cardiovascular: Normal rate, regular rhythm, normal heart sounds and intact distal pulses.  No murmur heard. Pulmonary/Chest: Effort normal and breath sounds normal. No respiratory distress.  Abdominal: Soft. Bowel sounds are normal. He exhibits no distension.  Musculoskeletal: He exhibits no edema.  Lymphadenopathy:    He has no cervical adenopathy.  Neurological: He is alert and oriented to person, place, and time. No cranial nerve deficit.  Skin: Skin is warm and dry.  Psychiatric: He has a normal mood and affect. His behavior is normal.  Vitals reviewed.         Assessment & Plan:

## 2018-07-08 NOTE — Assessment & Plan Note (Signed)
New.  Pt reports he has had sxs for years but has been dealing with it in his own way.  He stopped drinking and feels that this may have been a crutch for him.  He and wife are now in counseling but he is interested in starting medication to improve his sxs.  Will start Lexapro daily and monitor closely.  Pt expressed understanding and is in agreement w/ plan.

## 2018-07-08 NOTE — Assessment & Plan Note (Signed)
Following w/ Dr Alinda Money.  Has had good reports.  Not feeling overwhelmed or stressed by this dx.

## 2018-07-14 DIAGNOSIS — F4322 Adjustment disorder with anxiety: Secondary | ICD-10-CM | POA: Diagnosis not present

## 2018-08-10 ENCOUNTER — Other Ambulatory Visit: Payer: Self-pay

## 2018-08-10 ENCOUNTER — Ambulatory Visit: Payer: BLUE CROSS/BLUE SHIELD | Admitting: Family Medicine

## 2018-08-10 ENCOUNTER — Encounter: Payer: Self-pay | Admitting: Family Medicine

## 2018-08-10 VITALS — BP 123/80 | HR 62 | Temp 98.0°F | Resp 16 | Ht 68.0 in | Wt 151.0 lb

## 2018-08-10 DIAGNOSIS — F419 Anxiety disorder, unspecified: Secondary | ICD-10-CM | POA: Diagnosis not present

## 2018-08-10 MED ORDER — ESCITALOPRAM OXALATE 20 MG PO TABS: 20.0000 mg | ORAL_TABLET | Freq: Every day | ORAL | 3 refills | Status: DC

## 2018-08-10 NOTE — Progress Notes (Signed)
   Subjective:    Patient ID: Louis Fox, male    DOB: 03-05-1956, 62 y.o.   MRN: [id number]  HPI Anxiety- started on Lexapro 1 month ago.  No side effects.  'smaller things are better' but still continues to have anxiety about 'bigger things'.  Pt is again able to drink coffee w/o feeling jittery.  Wife has noticed some improvement but feels that there is room to improve.  No difficulty sleeping.   Review of Systems For ROS see HPI     Objective:   Physical Exam  Constitutional: He is oriented to person, place, and time. He appears well-developed and well-nourished. No distress.  HENT:  Head: Normocephalic and atraumatic.  Neurological: He is alert and oriented to person, place, and time.  Skin: Skin is warm and dry.  Psychiatric: He has a normal mood and affect. His behavior is normal. Thought content normal.  Vitals reviewed.         Assessment & Plan:

## 2018-08-10 NOTE — Patient Instructions (Addendum)
Follow up as needed or as scheduled Call or MyChart in 4-6 weeks to let me know how the higher dose is working Increase the Lexapro to 20mg  daily- 2 of what you have at home and then 1 of the new prescription Keep up the good work on counseling- I know it can be tough Call with any questions or concerns Enjoy the rest of your summer!!!

## 2018-08-10 NOTE — Assessment & Plan Note (Signed)
Improved but still having some anxiety with 'bigger things'.  Reviewed that we will never be able to completely resolve all anxiety.  Wife has noticed improvement but agrees there is room for better.  Will increase to 20mg  daily and continue to monitor

## 2018-08-11 DIAGNOSIS — F4322 Adjustment disorder with anxiety: Secondary | ICD-10-CM | POA: Diagnosis not present

## 2018-08-20 DIAGNOSIS — F4322 Adjustment disorder with anxiety: Secondary | ICD-10-CM | POA: Diagnosis not present

## 2018-09-22 DIAGNOSIS — F4320 Adjustment disorder, unspecified: Secondary | ICD-10-CM | POA: Diagnosis not present

## 2018-10-10 ENCOUNTER — Telehealth: Payer: Self-pay | Admitting: Family Medicine

## 2018-10-10 NOTE — Telephone Encounter (Signed)
Can we put him at one of your 11am spots?     Copied from [place] [phone]. Topic: General - Other >> Oct 10, 2018 12:35 PM Marin Olp L wrote: Reason for CRM: Patient wants to know if he can be work in w/ [facility] for cpe before 01/2019 because he had to cancel gis 10/17 due to a funeral? Please advise.

## 2018-10-10 NOTE — Telephone Encounter (Signed)
We can absolutely work him in since he is canceling for a funeral.

## 2018-10-13 ENCOUNTER — Encounter: Payer: BLUE CROSS/BLUE SHIELD | Admitting: Family Medicine

## 2018-10-18 ENCOUNTER — Encounter: Payer: BLUE CROSS/BLUE SHIELD | Admitting: Family Medicine

## 2018-10-26 DIAGNOSIS — F4320 Adjustment disorder, unspecified: Secondary | ICD-10-CM | POA: Diagnosis not present

## 2018-11-04 ENCOUNTER — Encounter: Payer: Self-pay | Admitting: Family Medicine

## 2018-11-04 ENCOUNTER — Other Ambulatory Visit: Payer: Self-pay

## 2018-11-04 ENCOUNTER — Ambulatory Visit: Payer: BLUE CROSS/BLUE SHIELD | Admitting: Family Medicine

## 2018-11-04 VITALS — BP 120/81 | HR 69 | Temp 98.1°F | Resp 16 | Ht 68.0 in | Wt 156.2 lb

## 2018-11-04 DIAGNOSIS — E785 Hyperlipidemia, unspecified: Secondary | ICD-10-CM | POA: Diagnosis not present

## 2018-11-04 DIAGNOSIS — Z125 Encounter for screening for malignant neoplasm of prostate: Secondary | ICD-10-CM | POA: Diagnosis not present

## 2018-11-04 DIAGNOSIS — Z0000 Encounter for general adult medical examination without abnormal findings: Secondary | ICD-10-CM | POA: Diagnosis not present

## 2018-11-04 DIAGNOSIS — Z23 Encounter for immunization: Secondary | ICD-10-CM | POA: Diagnosis not present

## 2018-11-04 LAB — CBC WITH DIFFERENTIAL/PLATELET
Basophils Absolute: 0.1 10*3/uL (ref 0.0–0.1)
Basophils Relative: 1.2 % (ref 0.0–3.0)
Eosinophils Absolute: 0.2 10*3/uL (ref 0.0–0.7)
Eosinophils Relative: 2.8 % (ref 0.0–5.0)
HCT: 44.9 % (ref 39.0–52.0)
Hemoglobin: 15.8 g/dL (ref 13.0–17.0)
Lymphocytes Relative: 22.4 % (ref 12.0–46.0)
Lymphs Abs: 1.5 10*3/uL (ref 0.7–4.0)
MCHC: 35.1 g/dL (ref 30.0–36.0)
MCV: 89.9 fl (ref 78.0–100.0)
Monocytes Absolute: 0.4 10*3/uL (ref 0.1–1.0)
Monocytes Relative: 6.4 % (ref 3.0–12.0)
Neutro Abs: 4.4 10*3/uL (ref 1.4–7.7)
Neutrophils Relative %: 67.2 % (ref 43.0–77.0)
Platelets: 243 10*3/uL (ref 150.0–400.0)
RBC: 4.99 Mil/uL (ref 4.22–5.81)
RDW: 12.8 % (ref 11.5–15.5)
WBC: 6.6 10*3/uL (ref 4.0–10.5)

## 2018-11-04 LAB — BASIC METABOLIC PANEL
BUN: 19 mg/dL (ref 6–23)
CO2: 31 mEq/L (ref 19–32)
Calcium: 10 mg/dL (ref 8.4–10.5)
Chloride: 101 mEq/L (ref 96–112)
Creatinine, Ser: 0.81 mg/dL (ref 0.40–1.50)
GFR: 102.02 mL/min (ref >60.00)
Glucose, Bld: 92 mg/dL (ref 70–99)
Potassium: 4.7 mEq/L (ref 3.5–5.1)
Sodium: 138 mEq/L (ref 135–145)

## 2018-11-04 LAB — LIPID PANEL
Cholesterol: 225 mg/dL — ABNORMAL HIGH (ref 0–200)
HDL: 52.1 mg/dL (ref >39.00)
LDL Cholesterol: 142 mg/dL — ABNORMAL HIGH (ref 0–99)
NonHDL: 172.8
Total CHOL/HDL Ratio: 4
Triglycerides: 153 mg/dL — ABNORMAL HIGH (ref 0.0–149.0)
VLDL: 30.6 mg/dL (ref 0.0–40.0)

## 2018-11-04 LAB — HEPATIC FUNCTION PANEL
ALT: 10 U/L (ref 0–53)
AST: 16 U/L (ref 0–37)
Albumin: 4.9 g/dL (ref 3.5–5.2)
Alkaline Phosphatase: 48 U/L (ref 39–117)
Bilirubin, Direct: 0.1 mg/dL (ref 0.0–0.3)
Total Bilirubin: 0.7 mg/dL (ref 0.2–1.2)
Total Protein: 7.2 g/dL (ref 6.0–8.3)

## 2018-11-04 LAB — PSA: PSA: 1.59 ng/mL (ref 0.10–4.00)

## 2018-11-04 LAB — TSH: TSH: 3.03 u[IU]/mL (ref 0.35–4.50)

## 2018-11-04 NOTE — Patient Instructions (Signed)
Follow up in 1 year or as needed We'll notify you of your lab results and make any changes if needed Continue to work on healthy diet and regular exercise- you look great! Call with any questions or concerns Happy Holidays!!!

## 2018-11-04 NOTE — Progress Notes (Signed)
   Subjective:    Patient ID: Louis Fox, male    DOB: [birth year], 62 y.o.   MRN: [id number]  HPI CPE- UTD on colonoscopy, Tdap.  Due for flu.   Review of Systems  Patient reports no vision/hearing changes, anorexia, fever ,adenopathy, persistant/recurrent hoarseness, swallowing issues, chest pain, palpitations, edema, persistant/recurrent cough, hemoptysis, dyspnea (rest,exertional, paroxysmal nocturnal), gastrointestinal  bleeding (melena, rectal bleeding), abdominal pain, excessive heart burn, GU symptoms (dysuria, hematuria, voiding/incontinence issues) syncope, focal weakness, memory loss, numbness & tingling, skin/hair/nail changes, depression, anxiety, abnormal bruising/bleeding, musculoskeletal symptoms/signs.     Objective:   Physical Exam General Appearance:    Alert, cooperative, no distress, appears stated age  Head:    Normocephalic, without obvious abnormality, atraumatic  Eyes:    PERRL, conjunctiva/corneas clear, EOM's intact, fundi    benign, both eyes       Ears:    Normal TM's and external ear canals, both ears  Nose:   Nares normal, septum midline, mucosa normal, no drainage   or sinus tenderness  Throat:   Lips, mucosa, and tongue normal; teeth and gums normal  Neck:   Supple, symmetrical, trachea midline, no adenopathy;       thyroid:  No enlargement/tenderness/nodules  Back:     Symmetric, no curvature, ROM normal, no CVA tenderness  Lungs:     Clear to auscultation bilaterally, respirations unlabored  Chest wall:    No tenderness or deformity  Heart:    Regular rate and rhythm, S1 and S2 normal, no murmur, rub   or gallop  Abdomen:     Soft, non-tender, bowel sounds active all four quadrants,    no masses, no organomegaly  Genitalia:    Deferred to urology  Rectal:    Extremities:   Extremities normal, atraumatic, no cyanosis or edema  Pulses:   2+ and symmetric all extremities  Skin:   Skin color, texture, turgor normal, no rashes or lesions  Lymph  nodes:   Cervical, supraclavicular, and axillary nodes normal  Neurologic:   CNII-XII intact. Normal strength, sensation and reflexes      throughout          Assessment & Plan:

## 2018-11-04 NOTE — Assessment & Plan Note (Signed)
Pt's PE WNL.  UTD on colonoscopy, urology.  Flu shot given today.  Check labs.  Anticipatory guidance provided.

## 2018-11-07 ENCOUNTER — Encounter: Payer: Self-pay | Admitting: General Practice

## 2018-11-10 DIAGNOSIS — F4322 Adjustment disorder with anxiety: Secondary | ICD-10-CM | POA: Diagnosis not present

## 2018-12-07 ENCOUNTER — Other Ambulatory Visit: Payer: Self-pay | Admitting: Family Medicine

## 2019-01-10 DIAGNOSIS — F4322 Adjustment disorder with anxiety: Secondary | ICD-10-CM | POA: Diagnosis not present

## 2019-01-24 DIAGNOSIS — F4322 Adjustment disorder with anxiety: Secondary | ICD-10-CM | POA: Diagnosis not present

## 2019-02-07 DIAGNOSIS — F4322 Adjustment disorder with anxiety: Secondary | ICD-10-CM | POA: Diagnosis not present

## 2019-02-21 DIAGNOSIS — F4322 Adjustment disorder with anxiety: Secondary | ICD-10-CM | POA: Diagnosis not present

## 2019-03-01 DIAGNOSIS — Z85528 Personal history of other malignant neoplasm of kidney: Secondary | ICD-10-CM | POA: Diagnosis not present

## 2019-03-06 ENCOUNTER — Ambulatory Visit (HOSPITAL_COMMUNITY)
Admission: RE | Admit: 2019-03-06 | Discharge: 2019-03-06 | Disposition: A | Discharge status: Home / Self Care | Payer: BLUE CROSS/BLUE SHIELD | Source: Ambulatory Visit | Attending: Urology | Admitting: Urology

## 2019-03-06 ENCOUNTER — Other Ambulatory Visit (HOSPITAL_COMMUNITY): Payer: Self-pay | Admitting: Urology

## 2019-03-06 DIAGNOSIS — Z85528 Personal history of other malignant neoplasm of kidney: Secondary | ICD-10-CM

## 2019-03-06 DIAGNOSIS — C649 Malignant neoplasm of unspecified kidney, except renal pelvis: Secondary | ICD-10-CM | POA: Diagnosis not present

## 2019-03-06 DIAGNOSIS — C641 Malignant neoplasm of right kidney, except renal pelvis: Secondary | ICD-10-CM | POA: Diagnosis not present

## 2019-03-10 DIAGNOSIS — Z85528 Personal history of other malignant neoplasm of kidney: Secondary | ICD-10-CM | POA: Diagnosis not present

## 2019-03-16 DIAGNOSIS — F4322 Adjustment disorder with anxiety: Secondary | ICD-10-CM | POA: Diagnosis not present

## 2019-04-11 ENCOUNTER — Other Ambulatory Visit: Payer: Self-pay | Admitting: Family Medicine

## 2019-08-12 ENCOUNTER — Other Ambulatory Visit: Payer: Self-pay | Admitting: Family Medicine

## 2019-11-07 ENCOUNTER — Other Ambulatory Visit: Payer: Self-pay

## 2019-11-07 ENCOUNTER — Ambulatory Visit (INDEPENDENT_AMBULATORY_CARE_PROVIDER_SITE_OTHER): Payer: BC Managed Care – PPO | Admitting: Family Medicine

## 2019-11-07 ENCOUNTER — Encounter: Payer: Self-pay | Admitting: Family Medicine

## 2019-11-07 VITALS — BP 123/72 | HR 80 | Temp 97.8°F | Resp 16 | Ht 66.0 in | Wt 162.5 lb

## 2019-11-07 DIAGNOSIS — Z125 Encounter for screening for malignant neoplasm of prostate: Secondary | ICD-10-CM | POA: Diagnosis not present

## 2019-11-07 DIAGNOSIS — Z23 Encounter for immunization: Secondary | ICD-10-CM | POA: Diagnosis not present

## 2019-11-07 DIAGNOSIS — C641 Malignant neoplasm of right kidney, except renal pelvis: Secondary | ICD-10-CM

## 2019-11-07 DIAGNOSIS — E785 Hyperlipidemia, unspecified: Secondary | ICD-10-CM | POA: Insufficient documentation

## 2019-11-07 DIAGNOSIS — Z0000 Encounter for general adult medical examination without abnormal findings: Secondary | ICD-10-CM

## 2019-11-07 LAB — BASIC METABOLIC PANEL
BUN: 14 mg/dL (ref 6–23)
CO2: 29 mEq/L (ref 19–32)
Calcium: 9.4 mg/dL (ref 8.4–10.5)
Chloride: 101 mEq/L (ref 96–112)
Creatinine, Ser: 0.72 mg/dL (ref 0.40–1.50)
GFR: 109.61 mL/min (ref >60.00)
Glucose, Bld: 95 mg/dL (ref 70–99)
Potassium: 4.4 mEq/L (ref 3.5–5.1)
Sodium: 136 mEq/L (ref 135–145)

## 2019-11-07 LAB — LDL CHOLESTEROL, DIRECT: Direct LDL: 134 mg/dL

## 2019-11-07 LAB — CBC WITH DIFFERENTIAL/PLATELET
Basophils Absolute: 0.1 10*3/uL (ref 0.0–0.1)
Basophils Relative: 1.3 % (ref 0.0–3.0)
Eosinophils Absolute: 0.3 10*3/uL (ref 0.0–0.7)
Eosinophils Relative: 4.4 % (ref 0.0–5.0)
HCT: 44.6 % (ref 39.0–52.0)
Hemoglobin: 15.2 g/dL (ref 13.0–17.0)
Lymphocytes Relative: 25.9 % (ref 12.0–46.0)
Lymphs Abs: 1.6 10*3/uL (ref 0.7–4.0)
MCHC: 34 g/dL (ref 30.0–36.0)
MCV: 91.9 fl (ref 78.0–100.0)
Monocytes Absolute: 0.5 10*3/uL (ref 0.1–1.0)
Monocytes Relative: 8.3 % (ref 3.0–12.0)
Neutro Abs: 3.6 10*3/uL (ref 1.4–7.7)
Neutrophils Relative %: 60.1 % (ref 43.0–77.0)
Platelets: 264 10*3/uL (ref 150.0–400.0)
RBC: 4.86 Mil/uL (ref 4.22–5.81)
RDW: 12.5 % (ref 11.5–15.5)
WBC: 6 10*3/uL (ref 4.0–10.5)

## 2019-11-07 LAB — LIPID PANEL
Cholesterol: 236 mg/dL — ABNORMAL HIGH (ref 0–200)
HDL: 53.7 mg/dL (ref >39.00)
NonHDL: 182.35
Total CHOL/HDL Ratio: 4
Triglycerides: 222 mg/dL — ABNORMAL HIGH (ref 0.0–149.0)
VLDL: 44.4 mg/dL — ABNORMAL HIGH (ref 0.0–40.0)

## 2019-11-07 LAB — HEPATIC FUNCTION PANEL
ALT: 11 U/L (ref 0–53)
AST: 17 U/L (ref 0–37)
Albumin: 4.5 g/dL (ref 3.5–5.2)
Alkaline Phosphatase: 56 U/L (ref 39–117)
Bilirubin, Direct: 0.1 mg/dL (ref 0.0–0.3)
Total Bilirubin: 0.5 mg/dL (ref 0.2–1.2)
Total Protein: 6.7 g/dL (ref 6.0–8.3)

## 2019-11-07 LAB — TSH: TSH: 3.49 u[IU]/mL (ref 0.35–4.50)

## 2019-11-07 LAB — PSA: PSA: 1.74 ng/mL (ref 0.10–4.00)

## 2019-11-07 NOTE — Patient Instructions (Signed)
Follow up in 1 year or as needed We'll notify you of your lab results and make any changes if needed Continue to work on healthy diet and regular exercise- you're doing great!!! Call with any questions or concerns Stay Safe!! Stay Healthy!!! 

## 2019-11-07 NOTE — Assessment & Plan Note (Signed)
Pt has hx of elevated LDL but has been able to control w/ diet and exercise.  Check labs.  Adjust meds prn

## 2019-11-07 NOTE — Progress Notes (Signed)
   Subjective:    Patient ID: Louis Fox, male    DOB: Nov 07, 1956, 63 y.o.   MRN: [id number]  HPI CPE- UTD on colonoscopy, Tdap.  Due for flu.  No concerns today.   Review of Systems Patient reports no vision/hearing changes, anorexia, fever ,adenopathy, persistant/recurrent hoarseness, swallowing issues, chest pain, palpitations, edema, persistant/recurrent cough, hemoptysis, dyspnea (rest,exertional, paroxysmal nocturnal), gastrointestinal  bleeding (melena, rectal bleeding), abdominal pain, excessive heart burn, GU symptoms (dysuria, hematuria, voiding/incontinence issues) syncope, focal weakness, memory loss, numbness & tingling, skin/hair/nail changes, depression, anxiety, abnormal bruising/bleeding, musculoskeletal symptoms/signs.     Objective:   Physical Exam General Appearance:    Alert, cooperative, no distress, appears stated age  Head:    Normocephalic, without obvious abnormality, atraumatic  Eyes:    PERRL, conjunctiva/corneas clear, EOM's intact, fundi    benign, both eyes       Ears:    Normal TM's and external ear canals, both ears  Nose:   Deferred due to COVID  Throat:   Neck:   Supple, symmetrical, trachea midline, no adenopathy;       thyroid:  No enlargement/tenderness/nodules  Back:     Symmetric, no curvature, ROM normal, no CVA tenderness  Lungs:     Clear to auscultation bilaterally, respirations unlabored  Chest wall:    No tenderness or deformity  Heart:    Regular rate and rhythm, S1 and S2 normal, no murmur, rub   or gallop  Abdomen:     Soft, non-tender, bowel sounds active all four quadrants,    no masses, no organomegaly  Genitalia:    Deferred to urology  Rectal:    Extremities:   Extremities normal, atraumatic, no cyanosis or edema  Pulses:   2+ and symmetric all extremities  Skin:   Skin color, texture, turgor normal, no rashes or lesions  Lymph nodes:   Cervical, supraclavicular, and axillary nodes normal  Neurologic:   CNII-XII intact.  Normal strength, sensation and reflexes      throughout          Assessment & Plan:

## 2019-11-07 NOTE — Assessment & Plan Note (Signed)
Pt's PE WNL.  UTD on colonoscopy, Tdap.  Flu shot given.  Check labs.  Anticipatory guidance provided.

## 2019-11-07 NOTE — Assessment & Plan Note (Signed)
Follow w/ Dr Alinda Money (Urology)

## 2019-12-13 ENCOUNTER — Other Ambulatory Visit: Payer: Self-pay | Admitting: Family Medicine

## 2020-02-13 ENCOUNTER — Other Ambulatory Visit: Payer: Self-pay | Admitting: Family Medicine

## 2020-03-12 ENCOUNTER — Other Ambulatory Visit (HOSPITAL_COMMUNITY): Payer: Self-pay | Admitting: Urology

## 2020-03-12 ENCOUNTER — Ambulatory Visit (HOSPITAL_COMMUNITY)
Admission: RE | Admit: 2020-03-12 | Discharge: 2020-03-12 | Disposition: A | Discharge status: Home / Self Care | Payer: Medicare Other | Source: Ambulatory Visit | Attending: Urology | Admitting: Urology

## 2020-03-12 ENCOUNTER — Other Ambulatory Visit: Payer: Self-pay

## 2020-03-12 DIAGNOSIS — Z85528 Personal history of other malignant neoplasm of kidney: Secondary | ICD-10-CM | POA: Diagnosis not present

## 2020-03-12 DIAGNOSIS — C649 Malignant neoplasm of unspecified kidney, except renal pelvis: Secondary | ICD-10-CM | POA: Diagnosis not present

## 2020-03-20 DIAGNOSIS — Z85528 Personal history of other malignant neoplasm of kidney: Secondary | ICD-10-CM | POA: Diagnosis not present

## 2020-04-26 ENCOUNTER — Encounter: Payer: Self-pay | Admitting: Family Medicine

## 2020-05-02 ENCOUNTER — Encounter: Payer: Self-pay | Admitting: Family Medicine

## 2020-05-02 ENCOUNTER — Other Ambulatory Visit: Payer: Self-pay

## 2020-05-02 ENCOUNTER — Ambulatory Visit (INDEPENDENT_AMBULATORY_CARE_PROVIDER_SITE_OTHER): Payer: Medicare Other | Admitting: Family Medicine

## 2020-05-02 VITALS — BP 118/78 | HR 72 | Temp 97.9°F | Resp 16 | Ht 66.0 in | Wt 163.4 lb

## 2020-05-02 DIAGNOSIS — L578 Other skin changes due to chronic exposure to nonionizing radiation: Secondary | ICD-10-CM

## 2020-05-02 DIAGNOSIS — L219 Seborrheic dermatitis, unspecified: Secondary | ICD-10-CM | POA: Diagnosis not present

## 2020-05-02 MED ORDER — CICLOPIROX 1 % EX SHAM: MEDICATED_SHAMPOO | CUTANEOUS | 0 refills | Status: DC

## 2020-05-02 NOTE — Patient Instructions (Signed)
Follow up as needed or as scheduled We'll call you with the dermatology appt Use the Ciclopirox shampoo twice weekly x4 weeks for the spots on the scalp You can still use hydrocortisone as needed Call with any questions or concerns Have a great weekend!

## 2020-05-02 NOTE — Progress Notes (Signed)
   Subjective:    Patient ID: Louis Fox, male    DOB: November 25, 1956, 64 y.o.   MRN: [id number]  HPI Skin lesion- pt has area on forehead that 'comes and goes' but is more prominent in the summer.  'it hurts enough that I can tell you where it is'.  No oozing or drainage.  Has area on nose that has since healed.  Has dry patches along hairline at the neck that improves w/ hydrocortisone.  Mild itching, not painful.   Review of Systems For ROS see HPI   This visit occurred during the SARS-CoV-2 public health emergency.  Safety protocols were in place, including screening questions prior to the visit, additional usage of staff PPE, and extensive cleaning of exam room while observing appropriate contact time as indicated for disinfecting solutions.       Objective:   Physical Exam Vitals reviewed.  Constitutional:      General: He is not in acute distress.    Appearance: He is normal weight. He is not ill-appearing.  HENT:     Head: Normocephalic and atraumatic.  Skin:    General: Skin is warm and dry.     Findings: Lesion (small pearly lesion on R lateral nostril) present.     Comments: Diffuse sun damage of face and arms Dry, plaque like areas along posterior hairline  Neurological:     Mental Status: He is alert.           Assessment & Plan:  Seborrheic dermatitis- new.  Start Ciclopirox shampoo twice weekly and continue topical steroids as needed.  Pt expressed understanding and is in agreement w/ plan.   Sun damage- ongoing issue.  Refer to derm for complete evaluation.  Pt expressed understanding and is in agreement w/ plan.

## 2020-06-17 DIAGNOSIS — S0990XA Unspecified injury of head, initial encounter: Secondary | ICD-10-CM | POA: Diagnosis not present

## 2020-06-17 DIAGNOSIS — S01511A Laceration without foreign body of lip, initial encounter: Secondary | ICD-10-CM | POA: Diagnosis not present

## 2020-06-17 DIAGNOSIS — S022XXA Fracture of nasal bones, initial encounter for closed fracture: Secondary | ICD-10-CM | POA: Diagnosis not present

## 2020-06-17 DIAGNOSIS — Z905 Acquired absence of kidney: Secondary | ICD-10-CM | POA: Diagnosis not present

## 2020-06-17 DIAGNOSIS — S0083XA Contusion of other part of head, initial encounter: Secondary | ICD-10-CM | POA: Diagnosis not present

## 2020-07-08 ENCOUNTER — Other Ambulatory Visit: Payer: Self-pay

## 2020-07-08 ENCOUNTER — Ambulatory Visit (INDEPENDENT_AMBULATORY_CARE_PROVIDER_SITE_OTHER)
Admission: RE | Admit: 2020-07-08 | Discharge: 2020-07-08 | Disposition: A | Discharge status: Home / Self Care | Payer: Medicare Other | Source: Ambulatory Visit | Attending: Physician Assistant | Admitting: Physician Assistant

## 2020-07-08 ENCOUNTER — Ambulatory Visit (INDEPENDENT_AMBULATORY_CARE_PROVIDER_SITE_OTHER): Payer: Medicare Other | Admitting: Physician Assistant

## 2020-07-08 ENCOUNTER — Encounter: Payer: Self-pay | Admitting: Physician Assistant

## 2020-07-08 VITALS — BP 118/74 | HR 77 | Temp 98.0°F | Resp 14 | Wt 164.0 lb

## 2020-07-08 DIAGNOSIS — M545 Low back pain: Secondary | ICD-10-CM | POA: Diagnosis not present

## 2020-07-08 DIAGNOSIS — R609 Edema, unspecified: Secondary | ICD-10-CM

## 2020-07-08 NOTE — Patient Instructions (Signed)
Please avoid heavy lifting and overexertion. Apply ice to the area of concern.  You will go to [place] office for x-ray: [place]. [place], [place].   Speak with Danae Chen at the front desk to get the [place] scheduled.   Notify us ASAP if you note any new or worsening symptoms.

## 2020-07-08 NOTE — Progress Notes (Signed)
Patient presents to clinic today c/o significant swelling to his lower back x3 weeks.  [place] door, flinging it into him.  Notes the still door hit him in the face causing him to lose consciousness and subsequently fall to the ground.  Was awoken by his wife who took him to look only ER.  Endorses assessment there including brain imaging which was unremarkable for acute intracranial abnormality.  Did have x-rays taken of his facial bones was noted to have a broken nose and laceration to lower lip, repaired by suture.  Patient states he was told at that time his examination otherwise was unremarkable, including examination of his back.  Patient states that since then he has noted continued swelling of his lower back starting at midline and moving over to the right side of lower back.  Notes the area feels warm to him.  Denies any bruising, redness or tenderness.  Denies any joint pain or decreased range of motion.  States other than the swelling being there he feels just fine.  Past Medical History:  Diagnosis Date  . Arthritis   . Family history of punctured lung    patient with hx of puncture lung as a teenager - left lung   . GERD (gastroesophageal reflux disease)    hx of  . History of kidney cancer   . Pancreatitis   . Pseudogout     Current Outpatient Medications on File Prior to Visit  Medication Sig Dispense Refill  . acetaminophen (TYLENOL) 500 MG tablet Take 1,000 mg by mouth daily as needed for mild pain.     . Ciclopirox 1 % shampoo Shampoo twice weekly x4 weeks 120 mL 0  . escitalopram (LEXAPRO) 20 MG tablet TAKE 1 TABLET BY MOUTH EVERY DAY 30 tablet 6  . ibuprofen (ADVIL) 600 MG tablet Take 600 mg by mouth every 6 (six) hours as needed.    . TURMERIC PO Take by mouth.     No current facility-administered medications on file prior to visit.    Allergies  Allergen Reactions  . Shellfish Allergy Swelling     Family History  Problem Relation Age of Onset  . Stroke Mother   . Hypertension Brother   . Cancer Brother        kidney    Social History   Socioeconomic History  . Marital status: Married    Spouse name: Not on file  . Number of children: Not on file  . Years of education: Not on file  . Highest education level: Not on file  Occupational History  . Not on file  Tobacco Use  . Smoking status: Never Smoker  . Smokeless tobacco: Never Used  Substance and Sexual Activity  . Alcohol use: No  . Drug use: No  . Sexual activity: Not on file  Other Topics Concern  . Not on file  Social History Narrative  . Not on file   Social Determinants of Health   Financial Resource Strain:   . Difficulty of Paying Living Expenses:   Food Insecurity:   . Worried About Charity fundraiser in the Last Year:   . Arboriculturist in the Last Year:   Transportation Needs:   . Film/video editor (Medical):   Marland Kitchen Lack of Transportation (Non-Medical):   Physical Activity:   . Days of Exercise per Week:   . Minutes of Exercise per Session:  Stress:   . Feeling of Stress :   Social Connections:   . Frequency of Communication with Friends and Family:   . Frequency of Social Gatherings with Friends and Family:   . Attends Religious Services:   . Active Member of Clubs or Organizations:   . Attends Archivist Meetings:   Marland Kitchen Marital Status:    Review of Systems - See HPI.  All other ROS are negative.  BP 118/74 (BP Location: Right Arm, Patient Position: Sitting)   Pulse 77   Temp 98 F (36.7 C)   Resp 14   Wt 164 lb (74.4 kg)   SpO2 99%   BMI 26.47 kg/m   Physical Exam Vitals reviewed.  HENT:     Head: Normocephalic and atraumatic.  Cardiovascular:     Rate and Rhythm: Normal rate and regular rhythm.     Heart sounds: Normal heart sounds.  Pulmonary:     Effort: Pulmonary effort is normal.  Musculoskeletal:     Cervical back: Neck supple.     Lumbar back:  Swelling present. No edema, spasms, tenderness or bony tenderness. Normal range of motion.       Back:     Assessment/Plan: 1. Soft tissue swelling of back Giving history and exam findings most consistent with a large hematoma but this needs further assessment especially giving the area overlies the joint space.  Will obtain x-ray lumbar spine and ultrasound of the swelling to further assess.  Patient to apply ice to the area.  Avoid heavy lifting and overexertion.  Strict ER precautions reviewed with patient. - DG Lumbar Spine Complete; Future - [place] MiscellaneoUS Localization; Future  This visit occurred during the SARS-CoV-2 public health emergency.  Safety protocols were in place, including screening questions prior to the visit, additional usage of staff PPE, and extensive cleaning of exam room while observing appropriate contact time as indicated for disinfecting solutions.     Leeanne Rio, PA-C

## 2020-07-10 DIAGNOSIS — H524 Presbyopia: Secondary | ICD-10-CM | POA: Diagnosis not present

## 2020-07-12 ENCOUNTER — Other Ambulatory Visit: Payer: Self-pay

## 2020-07-12 ENCOUNTER — Ambulatory Visit
Admission: RE | Admit: 2020-07-12 | Discharge: 2020-07-12 | Disposition: A | Discharge status: Home / Self Care | Payer: Medicare Other | Source: Ambulatory Visit | Attending: Physician Assistant | Admitting: Physician Assistant

## 2020-07-12 DIAGNOSIS — R609 Edema, unspecified: Secondary | ICD-10-CM

## 2020-07-12 DIAGNOSIS — R1909 Other intra-abdominal and pelvic swelling, mass and lump: Secondary | ICD-10-CM | POA: Diagnosis not present

## 2020-07-31 ENCOUNTER — Other Ambulatory Visit: Payer: Self-pay

## 2020-07-31 ENCOUNTER — Encounter: Payer: Self-pay | Admitting: Dermatology

## 2020-07-31 ENCOUNTER — Ambulatory Visit: Payer: Medicare Other | Admitting: Dermatology

## 2020-07-31 DIAGNOSIS — L821 Other seborrheic keratosis: Secondary | ICD-10-CM

## 2020-07-31 DIAGNOSIS — L219 Seborrheic dermatitis, unspecified: Secondary | ICD-10-CM | POA: Diagnosis not present

## 2020-07-31 DIAGNOSIS — L570 Actinic keratosis: Secondary | ICD-10-CM

## 2020-07-31 NOTE — Patient Instructions (Signed)
Seborrheic Keratosis °A seborrheic keratosis is a common, noncancerous (benign) skin growth. These growths are velvety, waxy, rough, tan, brown, or black spots that appear on the skin. These skin growths can be flat or raised, and scaly. °What are the causes? °The cause of this condition is not known. °What increases the risk? °You are more likely to develop this condition if you: °· Have a family history of seborrheic keratosis. °· Are 50 or older. °· Are pregnant. °· Have had estrogen replacement therapy. °What are the signs or symptoms? °Symptoms of this condition include growths on the face, chest, shoulders, back, or other areas. These growths: °· Are usually painless, but may become irritated and itchy. °· Can be yellow, brown, black, or other colors. °· Are slightly raised or have a flat surface. °· Are sometimes rough or wart-like in texture. °· Are often velvety or waxy on the surface. °· Are round or oval-shaped. °· Often occur in groups, but may occur as a single growth. °How is this diagnosed? °This condition is diagnosed with a medical history and physical exam. °· A sample of the growth may be tested (skin biopsy). °· You may need to see a skin specialist (dermatologist). °How is this treated? °Treatment is not usually needed for this condition, unless the growths are irritated or bleed often. °· You may also choose to have the growths removed if you do not like their appearance. °? Most commonly, these growths are treated with a procedure in which liquid nitrogen is applied to "freeze" off the growth (cryosurgery). °? They may also be burned off with electricity (electrocautery) or removed by scraping (curettage). °Follow these instructions at home: °· Watch your growth for any changes. °· Keep all follow-up visits as told by your health care provider. This is important. °· Do not scratch or pick at the growth or growths. This can cause them to become irritated or infected. °Contact a health care  provider if: °· You suddenly have many new growths. °· Your growth bleeds, itches, or hurts. °· Your growth suddenly becomes larger or changes color. °Summary °· A seborrheic keratosis is a common, noncancerous (benign) skin growth. °· Treatment is not usually needed for this condition, unless the growths are irritated or bleed often. °· Watch your growth for any changes. °· Contact a health care provider if you suddenly have many new growths or your growth suddenly becomes larger or changes color. °· Keep all follow-up visits as told by your health care provider. This is important. °This information is not intended to replace advice given to you by your health care provider. Make sure you discuss any questions you have with your health care provider. °Document Revised: 04/28/2018 Document Reviewed: 04/28/2018 °Elsevier Patient Education © 2020 [employer] Inc. ° °

## 2020-09-13 ENCOUNTER — Encounter: Payer: Self-pay | Admitting: Dermatology

## 2020-09-13 NOTE — Progress Notes (Signed)
   New Patient   Subjective  Louis Fox is a 64 y.o. male who presents for the following: New Patient (Initial Visit) (Pt stated---back of the scalp--dry, flaky, itching--over 1 year. Tried Rx shampoo it help. Check skin.).  Rash Location: Scalp Duration:  Quality:  Associated Signs/Symptoms: Modifying Factors:  Severity:  Timing: Context:    The following portions of the chart were reviewed this encounter and updated as appropriate: Tobacco  Allergies  Meds  Problems  Med Hx  Surg Hx  Fam Hx      Objective  Well appearing patient in no apparent distress; mood and affect are within normal limits.  A focused examination was performed including Head, neck, back, chest, arms, legs.. Relevant physical exam findings are noted in the Assessment and Plan.   Assessment & Plan  AK (actinic keratosis) Right Elbow - Posterior  Destruction of lesion - Right Elbow - Posterior Complexity: simple   Destruction method: cryotherapy   Informed consent: discussed and consent obtained   Timeout:  patient name, date of birth, surgical site, and procedure verified Lesion destroyed using liquid nitrogen: Yes   Region frozen until ice ball extended beyond lesion: Yes   Cryotherapy cycles:  5 Outcome: patient tolerated procedure well with no complications    Seborrheic keratosis (3) Left Ankle - Anterior; Right Foot - Anterior; Left Thigh - Posterior  Leave if stable  Seborrheic dermatitis (2) Head - Anterior (Face); Scalp  Over-the-counter 1% hc lotion.  To call if the rash flares and he needs a prescription.

## 2020-10-23 ENCOUNTER — Other Ambulatory Visit: Payer: Self-pay | Admitting: Family Medicine

## 2020-11-08 ENCOUNTER — Ambulatory Visit (INDEPENDENT_AMBULATORY_CARE_PROVIDER_SITE_OTHER): Payer: Medicare Other | Admitting: Family Medicine

## 2020-11-08 ENCOUNTER — Encounter: Payer: Self-pay | Admitting: Family Medicine

## 2020-11-08 ENCOUNTER — Other Ambulatory Visit: Payer: Self-pay

## 2020-11-08 ENCOUNTER — Other Ambulatory Visit (INDEPENDENT_AMBULATORY_CARE_PROVIDER_SITE_OTHER): Payer: Medicare Other

## 2020-11-08 VITALS — BP 134/70 | HR 58 | Temp 97.5°F | Resp 17 | Ht 68.0 in | Wt 165.0 lb

## 2020-11-08 DIAGNOSIS — C641 Malignant neoplasm of right kidney, except renal pelvis: Secondary | ICD-10-CM

## 2020-11-08 DIAGNOSIS — Z0000 Encounter for general adult medical examination without abnormal findings: Secondary | ICD-10-CM

## 2020-11-08 DIAGNOSIS — E785 Hyperlipidemia, unspecified: Secondary | ICD-10-CM | POA: Diagnosis not present

## 2020-11-08 DIAGNOSIS — Z85528 Personal history of other malignant neoplasm of kidney: Secondary | ICD-10-CM

## 2020-11-08 DIAGNOSIS — Z125 Encounter for screening for malignant neoplasm of prostate: Secondary | ICD-10-CM | POA: Diagnosis not present

## 2020-11-08 LAB — BASIC METABOLIC PANEL
BUN: 18 mg/dL (ref 6–23)
CO2: 28 mEq/L (ref 19–32)
Calcium: 9.2 mg/dL (ref 8.4–10.5)
Chloride: 100 mEq/L (ref 96–112)
Creatinine, Ser: 0.82 mg/dL (ref 0.40–1.50)
GFR: 91.96 mL/min (ref >60.00)
Glucose, Bld: 90 mg/dL (ref 70–99)
Potassium: 4.4 mEq/L (ref 3.5–5.1)
Sodium: 135 mEq/L (ref 135–145)

## 2020-11-08 LAB — HEPATIC FUNCTION PANEL
ALT: 13 U/L (ref 0–53)
AST: 18 U/L (ref 0–37)
Albumin: 4.2 g/dL (ref 3.5–5.2)
Alkaline Phosphatase: 48 U/L (ref 39–117)
Bilirubin, Direct: 0.1 mg/dL (ref 0.0–0.3)
Total Bilirubin: 0.5 mg/dL (ref 0.2–1.2)
Total Protein: 6.5 g/dL (ref 6.0–8.3)

## 2020-11-08 LAB — CBC WITH DIFFERENTIAL/PLATELET
Basophils Absolute: 0.1 10*3/uL (ref 0.0–0.1)
Basophils Relative: 0.9 % (ref 0.0–3.0)
Eosinophils Absolute: 0.3 10*3/uL (ref 0.0–0.7)
Eosinophils Relative: 4.8 % (ref 0.0–5.0)
HCT: 42.9 % (ref 39.0–52.0)
Hemoglobin: 14.7 g/dL (ref 13.0–17.0)
Lymphocytes Relative: 26.6 % (ref 12.0–46.0)
Lymphs Abs: 1.9 10*3/uL (ref 0.7–4.0)
MCHC: 34.4 g/dL (ref 30.0–36.0)
MCV: 90.2 fl (ref 78.0–100.0)
Monocytes Absolute: 0.6 10*3/uL (ref 0.1–1.0)
Monocytes Relative: 8.4 % (ref 3.0–12.0)
Neutro Abs: 4.3 10*3/uL (ref 1.4–7.7)
Neutrophils Relative %: 59.3 % (ref 43.0–77.0)
Platelets: 256 10*3/uL (ref 150.0–400.0)
RBC: 4.75 Mil/uL (ref 4.22–5.81)
RDW: 12.7 % (ref 11.5–15.5)
WBC: 7.2 10*3/uL (ref 4.0–10.5)

## 2020-11-08 LAB — PSA, MEDICARE: PSA: 1.5 ng/ml (ref 0.10–4.00)

## 2020-11-08 LAB — TSH: TSH: 4.16 u[IU]/mL (ref 0.35–4.50)

## 2020-11-08 LAB — LIPID PANEL
Cholesterol: 212 mg/dL — ABNORMAL HIGH (ref 0–200)
HDL: 47.9 mg/dL (ref >39.00)
NonHDL: 164.22
Total CHOL/HDL Ratio: 4
Triglycerides: 244 mg/dL — ABNORMAL HIGH (ref 0.0–149.0)
VLDL: 48.8 mg/dL — ABNORMAL HIGH (ref 0.0–40.0)

## 2020-11-08 LAB — LDL CHOLESTEROL, DIRECT: Direct LDL: 113 mg/dL

## 2020-11-08 NOTE — Patient Instructions (Addendum)
Follow up in 1 year or as needed Schedule a lab visit at your convenience We'll notify you of your lab results and make any changes if needed Keep up the good work on healthy diet and regular exercise You are up to date on all the things!!!  [place] job! Review the Living Will information and complete at your convenience Call with any questions or concerns Stay Safe!  Stay Healthy! Happy Holidays!!!   Preventive Care 36 Years and Older, Male Preventive care refers to lifestyle choices and visits with your health care provider that can promote health and wellness. This includes:  A yearly physical exam. This is also called an annual well check.  Regular dental and eye exams.  Immunizations.  Screening for certain conditions.  Healthy lifestyle choices, such as diet and exercise. What can I expect for my preventive care visit? Physical exam Your health care provider will check:  Height and weight. These may be used to calculate body mass index (BMI), which is a measurement that tells if you are at a healthy weight.  Heart rate and blood pressure.  Your skin for abnormal spots. Counseling Your health care provider may ask you questions about:  Alcohol, tobacco, and drug use.  Emotional well-being.  Home and relationship well-being.  Sexual activity.  Eating habits.  History of falls.  Memory and ability to understand (cognition).  Work and work Statistician. What immunizations do I need?  Influenza (flu) vaccine  This is recommended every year. Tetanus, diphtheria, and pertussis (Tdap) vaccine  You may need a Td booster every 10 years. Varicella (chickenpox) vaccine  You may need this vaccine if you have not already been vaccinated. Zoster (shingles) vaccine  You may need this after age 53. Pneumococcal conjugate (PCV13) vaccine  One dose is recommended after age 8. Pneumococcal polysaccharide (PPSV23) vaccine  One dose is recommended after age  19. Measles, mumps, and rubella (MMR) vaccine  You may need at least one dose of MMR if you were born in 1957 or later. You may also need a second dose. Meningococcal conjugate (MenACWY) vaccine  You may need this if you have certain conditions. Hepatitis A vaccine  You may need this if you have certain conditions or if you travel or work in places where you may be exposed to hepatitis A. Hepatitis B vaccine  You may need this if you have certain conditions or if you travel or work in places where you may be exposed to hepatitis B. Haemophilus influenzae type b (Hib) vaccine  You may need this if you have certain conditions. You may receive vaccines as individual doses or as more than one vaccine together in one shot (combination vaccines). Talk with your health care provider about the risks and benefits of combination vaccines. What tests do I need? Blood tests  Lipid and cholesterol levels. These may be checked every 5 years, or more frequently depending on your overall health.  Hepatitis C test.  Hepatitis B test. Screening  Lung cancer screening. You may have this screening every year starting at age 59 if you have a 30-pack-year history of smoking and currently smoke or have quit within the past 15 years.  Colorectal cancer screening. All adults should have this screening starting at age 15 and continuing until age 51. Your health care provider may recommend screening at age 75 if you are at increased risk. You will have tests every 1-10 years, depending on your results and the type of screening test.  Prostate  cancer screening. Recommendations will vary depending on your family history and other risks.  Diabetes screening. This is done by checking your blood sugar (glucose) after you have not eaten for a while (fasting). You may have this done every 1-3 years.  Abdominal aortic aneurysm (AAA) screening. You may need this if you are a current or former smoker.  Sexually  transmitted disease (STD) testing. Follow these instructions at home: Eating and drinking  Eat a diet that includes fresh fruits and vegetables, whole grains, lean protein, and low-fat dairy products. Limit your intake of foods with high amounts of sugar, saturated fats, and salt.  Take vitamin and mineral supplements as recommended by your health care provider.  Do not drink alcohol if your health care provider tells you not to drink.  If you drink alcohol: ? Limit how much you have to 0-2 drinks a day. ? Be aware of how much alcohol is in your drink. In the [place] one drink equals one 12 oz bottle of beer (355 mL), one 5 oz glass of wine (148 mL), or one 1 oz glass of hard liquor (44 mL). Lifestyle  Take daily care of your teeth and gums.  Stay active. Exercise for at least 30 minutes on 5 or more days each week.  Do not use any products that contain nicotine or tobacco, such as cigarettes, e-cigarettes, and chewing tobacco. If you need help quitting, ask your health care provider.  If you are sexually active, practice safe sex. Use a condom or other form of protection to prevent STIs (sexually transmitted infections).  Talk with your health care provider about taking a low-dose aspirin or statin. What's next?  Visit your health care provider once a year for a well check visit.  Ask your health care provider how often you should have your eyes and teeth checked.  Stay up to date on all vaccines. This information is not intended to replace advice given to you by your health care provider. Make sure you discuss any questions you have with your health care provider. Document Revised: 12/08/2018 Document Reviewed: 12/08/2018 Elsevier Patient Education  2020 [employer].

## 2020-11-08 NOTE — Assessment & Plan Note (Signed)
Ongoing issue for pt.  Attempting to control w/ diet and exercise.  Check labs and determine if medications are needed.

## 2020-11-08 NOTE — Assessment & Plan Note (Signed)
Pt's PE WNL.  UTD on colonoscopy, Tdap, flu, COVID.  Information provided on health care POA and living will.  Check labs.  Reviewed health maintenance schedule- handout provided.

## 2020-11-08 NOTE — Assessment & Plan Note (Signed)
Following w/ Dr Alinda Money

## 2020-11-08 NOTE — Progress Notes (Addendum)
Subjective:    Patient ID: Louis Fox, male    DOB: 12-30-1955, 64 y.o.   MRN: [id number]  HPI Here today for Welcome To Medicare CPE.  Risk Factors: Hyperlipidemia- trying to control w/ diet and exercise Physical Activity: rides bicycle, walking Depression: pt has hx of this, well controlled on Lexapro 20mg  Hearing: normal to conversational tones and whispered voice at 6 ft ADL's: independent Cognitive: normal linear thought process Home Safety: safe at home, lives w/ wife Height, Weight, BMI, Visual Acuity: see vitals, vision corrected to 20/20 w/ glasses Counseling: UTD on colonoscopy, Tdap, flu, and COVID vaccines (including booster) [place] will: information given Labs Ordered: See A&P Care Plan: See A&P   Reviewed past medical, surgical, family and social histories.   Patient Care Team    Relationship Specialty Notifications Start End  Midge Minium, MD PCP - General Family Medicine  05/19/17   Raynelle Bring, MD Consulting Physician Urology  05/19/17   Carol Ada, MD Consulting Physician Gastroenterology  05/19/17     Health Maintenance  Topic Date Due  . HIV Screening  Never done  . PNA vac Low Risk Adult (1 of 2 - PCV13) 05/02/2021 (Originally 01/04/2020)  . TETANUS/TDAP  12/17/2024  . COLONOSCOPY  05/09/2028  . INFLUENZA VACCINE  Completed  . COVID-19 Vaccine  Completed  . Hepatitis C Screening  Completed      Review of Systems Patient reports no vision/hearing changes, anorexia, fever ,adenopathy, persistant/recurrent hoarseness, swallowing issues, chest pain, palpitations, edema, persistant/recurrent cough, hemoptysis, dyspnea (rest,exertional, paroxysmal nocturnal), gastrointestinal  bleeding (melena, rectal bleeding), abdominal pain, excessive heart burn, GU symptoms (dysuria, hematuria, voiding/incontinence issues) syncope, focal weakness, memory loss, numbness & tingling, skin/hair/nail changes, depression, anxiety, abnormal  bruising/bleeding, musculoskeletal symptoms/signs.   This visit occurred during the SARS-CoV-2 public health emergency.  Safety protocols were in place, including screening questions prior to the visit, additional usage of staff PPE, and extensive cleaning of exam room while observing appropriate contact time as indicated for disinfecting solutions.       Objective:   Physical Exam General Appearance:    Alert, cooperative, no distress, appears stated age  Head:    Normocephalic, without obvious abnormality, atraumatic  Eyes:    PERRL, conjunctiva/corneas clear, EOM's intact, fundi    benign, both eyes       Ears:    Normal TM's and external ear canals, both ears  Nose:   Deferred due to COVID  Throat:   Neck:   Supple, symmetrical, trachea midline, no adenopathy;       thyroid:  No enlargement/tenderness/nodules  Back:     Symmetric, no curvature, ROM normal, no CVA tenderness  Lungs:     Clear to auscultation bilaterally, respirations unlabored  Chest wall:    No tenderness or deformity  Heart:    Regular rate and rhythm, S1 and S2 normal, no murmur, rub   or gallop  Abdomen:     Soft, non-tender, bowel sounds active all four quadrants,    no masses, no organomegaly  Genitalia:    deferred  Rectal:    Extremities:   Extremities normal, atraumatic, no cyanosis or edema  Pulses:   2+ and symmetric all extremities  Skin:   Skin color, texture, turgor normal, no rashes or lesions  Lymph nodes:   Cervical, supraclavicular, and axillary nodes normal  Neurologic:   CNII-XII intact. Normal strength, sensation and reflexes      throughout  Assessment & Plan:

## 2020-11-11 ENCOUNTER — Other Ambulatory Visit: Payer: Self-pay

## 2020-11-11 NOTE — Progress Notes (Signed)
Reviewed by patient via Mychart. 

## 2020-11-27 ENCOUNTER — Other Ambulatory Visit: Payer: Self-pay

## 2020-11-27 MED ORDER — FENOFIBRATE 160 MG PO TABS: 160.0000 mg | ORAL_TABLET | Freq: Every day | ORAL | 1 refills | Status: DC

## 2020-12-31 DIAGNOSIS — M531 Cervicobrachial syndrome: Secondary | ICD-10-CM | POA: Diagnosis not present

## 2020-12-31 DIAGNOSIS — M9902 Segmental and somatic dysfunction of thoracic region: Secondary | ICD-10-CM | POA: Diagnosis not present

## 2020-12-31 DIAGNOSIS — M5032 Other cervical disc degeneration, mid-cervical region, unspecified level: Secondary | ICD-10-CM | POA: Diagnosis not present

## 2020-12-31 DIAGNOSIS — M9901 Segmental and somatic dysfunction of cervical region: Secondary | ICD-10-CM | POA: Diagnosis not present

## 2021-01-03 DIAGNOSIS — M531 Cervicobrachial syndrome: Secondary | ICD-10-CM | POA: Diagnosis not present

## 2021-01-03 DIAGNOSIS — M9902 Segmental and somatic dysfunction of thoracic region: Secondary | ICD-10-CM | POA: Diagnosis not present

## 2021-01-03 DIAGNOSIS — M5032 Other cervical disc degeneration, mid-cervical region, unspecified level: Secondary | ICD-10-CM | POA: Diagnosis not present

## 2021-01-03 DIAGNOSIS — M9901 Segmental and somatic dysfunction of cervical region: Secondary | ICD-10-CM | POA: Diagnosis not present

## 2021-01-09 DIAGNOSIS — M9902 Segmental and somatic dysfunction of thoracic region: Secondary | ICD-10-CM | POA: Diagnosis not present

## 2021-01-09 DIAGNOSIS — M531 Cervicobrachial syndrome: Secondary | ICD-10-CM | POA: Diagnosis not present

## 2021-01-09 DIAGNOSIS — M5032 Other cervical disc degeneration, mid-cervical region, unspecified level: Secondary | ICD-10-CM | POA: Diagnosis not present

## 2021-01-09 DIAGNOSIS — M9901 Segmental and somatic dysfunction of cervical region: Secondary | ICD-10-CM | POA: Diagnosis not present

## 2021-01-16 DIAGNOSIS — M531 Cervicobrachial syndrome: Secondary | ICD-10-CM | POA: Diagnosis not present

## 2021-01-16 DIAGNOSIS — M9902 Segmental and somatic dysfunction of thoracic region: Secondary | ICD-10-CM | POA: Diagnosis not present

## 2021-01-16 DIAGNOSIS — M9901 Segmental and somatic dysfunction of cervical region: Secondary | ICD-10-CM | POA: Diagnosis not present

## 2021-01-16 DIAGNOSIS — M5032 Other cervical disc degeneration, mid-cervical region, unspecified level: Secondary | ICD-10-CM | POA: Diagnosis not present

## 2021-01-23 DIAGNOSIS — M531 Cervicobrachial syndrome: Secondary | ICD-10-CM | POA: Diagnosis not present

## 2021-01-23 DIAGNOSIS — M9902 Segmental and somatic dysfunction of thoracic region: Secondary | ICD-10-CM | POA: Diagnosis not present

## 2021-01-23 DIAGNOSIS — M5032 Other cervical disc degeneration, mid-cervical region, unspecified level: Secondary | ICD-10-CM | POA: Diagnosis not present

## 2021-01-23 DIAGNOSIS — M9901 Segmental and somatic dysfunction of cervical region: Secondary | ICD-10-CM | POA: Diagnosis not present

## 2021-01-27 DIAGNOSIS — M9902 Segmental and somatic dysfunction of thoracic region: Secondary | ICD-10-CM | POA: Diagnosis not present

## 2021-01-27 DIAGNOSIS — M9901 Segmental and somatic dysfunction of cervical region: Secondary | ICD-10-CM | POA: Diagnosis not present

## 2021-01-27 DIAGNOSIS — M5032 Other cervical disc degeneration, mid-cervical region, unspecified level: Secondary | ICD-10-CM | POA: Diagnosis not present

## 2021-01-27 DIAGNOSIS — M531 Cervicobrachial syndrome: Secondary | ICD-10-CM | POA: Diagnosis not present

## 2021-02-18 ENCOUNTER — Ambulatory Visit (HOSPITAL_COMMUNITY)
Admission: RE | Admit: 2021-02-18 | Discharge: 2021-02-18 | Disposition: A | Discharge status: Home / Self Care | Payer: Medicare Other | Source: Ambulatory Visit | Attending: Urology | Admitting: Urology

## 2021-02-18 ENCOUNTER — Other Ambulatory Visit: Payer: Self-pay

## 2021-02-18 ENCOUNTER — Other Ambulatory Visit (HOSPITAL_COMMUNITY): Payer: Self-pay | Admitting: Urology

## 2021-02-18 DIAGNOSIS — C649 Malignant neoplasm of unspecified kidney, except renal pelvis: Secondary | ICD-10-CM | POA: Diagnosis not present

## 2021-02-18 DIAGNOSIS — Z85528 Personal history of other malignant neoplasm of kidney: Secondary | ICD-10-CM | POA: Diagnosis not present

## 2021-02-28 DIAGNOSIS — Z85528 Personal history of other malignant neoplasm of kidney: Secondary | ICD-10-CM | POA: Diagnosis not present

## 2021-03-07 ENCOUNTER — Other Ambulatory Visit: Payer: Self-pay | Admitting: Family Medicine

## 2021-04-13 DIAGNOSIS — Z23 Encounter for immunization: Secondary | ICD-10-CM | POA: Diagnosis not present

## 2021-06-23 ENCOUNTER — Telehealth: Payer: Self-pay

## 2021-06-23 ENCOUNTER — Other Ambulatory Visit: Payer: Self-pay

## 2021-06-23 MED ORDER — FENOFIBRATE 160 MG PO TABS: 160.0000 mg | ORAL_TABLET | Freq: Every day | ORAL | 0 refills | Status: DC

## 2021-06-23 NOTE — Telephone Encounter (Signed)
Pt needs refill on fenofibrate 160 MG tablet [id number] - [place], [place] - [address]   Pt call back [phone]

## 2021-06-23 NOTE — Telephone Encounter (Signed)
Medication sent to pharmacy  

## 2021-06-24 ENCOUNTER — Other Ambulatory Visit: Payer: Self-pay

## 2021-06-24 ENCOUNTER — Ambulatory Visit (INDEPENDENT_AMBULATORY_CARE_PROVIDER_SITE_OTHER): Payer: Medicare Other | Admitting: Registered Nurse

## 2021-06-24 ENCOUNTER — Encounter: Payer: Self-pay | Admitting: Registered Nurse

## 2021-06-24 VITALS — BP 140/74 | HR 68 | Temp 98.2°F | Resp 18 | Ht 68.0 in | Wt 166.0 lb

## 2021-06-24 DIAGNOSIS — H6123 Impacted cerumen, bilateral: Secondary | ICD-10-CM

## 2021-06-24 DIAGNOSIS — H60333 Swimmer's ear, bilateral: Secondary | ICD-10-CM | POA: Diagnosis not present

## 2021-06-24 MED ORDER — HYDROCORTISONE-ACETIC ACID 1-2 % OT SOLN: 3.0000 [drp] | Freq: Three times a day (TID) | OTIC | 0 refills | Status: DC | PRN

## 2021-06-24 MED ORDER — OFLOXACIN 0.3 % OT SOLN: 5.0000 [drp] | Freq: Every day | OTIC | 0 refills | Status: DC

## 2021-06-24 NOTE — Patient Instructions (Addendum)
Mr. Louis Fox -   Ofloxacin 5 drops each ear daily. Should go away after a few days but please complete at least 5 days.  If persistent itching or irritation, can use Vosol-HC three drops each side daily as needed. This is just acetic acid (vinegar) and cortisone - very mild meds.   If symptoms do not resolve, we can consider ENT referral. Let me know if that's the case.  Thank you  [name]     If you have lab work done today you will be contacted with your lab results within the next 2 weeks.  If you have not heard from [place] then please contact us. The fastest way to get your results is to register for My Chart.   IF you received an x-ray today, you will receive an invoice from [facility]. Please contact [facility] at [phone] with questions or concerns regarding your invoice.   IF you received labwork today, you will receive an invoice from [place]. Please contact [facility] at [phone] with questions or concerns regarding your invoice.   Our billing staff will not be able to assist you with questions regarding bills from these companies.  You will be contacted with the lab results as soon as they are available. The fastest way to get your results is to activate your My Chart account. Instructions are located on the last page of this paperwork. If you have not heard from [place] regarding the results in 2 weeks, please contact this office.

## 2021-06-24 NOTE — Progress Notes (Signed)
Acute Office Visit  Subjective:    Patient ID: Louis Fox, male    DOB: 16-Mar-1956, 65 y.o.   MRN: [id number]  Chief Complaint  Patient presents with   Ear Fullness    Patient states he put his head under the water 5 days ago to catch clams and has been having some left ear fullness.    HPI Patient is in today for L ear fullness Ongoing 5 days Had been underwater catching clams Used wax remover at home - removed a good amount  Still muffled hearing in L ear Pressure, no pain No local adenopathy  Frequent otitis media as child but not as adult  Otherwise, notes he has stopped his lexapro after a long taper over about a month. Had felt numbed out / too flat. Now feeling ok. No interest in starting new medication at this time.   Past Medical History:  Diagnosis Date   Arthritis    Family history of punctured lung    patient with hx of puncture lung as a teenager - left lung    GERD (gastroesophageal reflux disease)    hx of   History of kidney cancer    Pancreatitis    Pseudogout     Past Surgical History:  Procedure Laterality Date   ESOPHAGOGASTRODUODENOSCOPY N/A 09/28/2015   Procedure: ESOPHAGOGASTRODUODENOSCOPY (EGD);  Surgeon: Inda Castle, MD;  Location: [facility];  Service: Endoscopy;  Laterality: N/A;   ROBOT ASSISTED LAPAROSCOPIC NEPHRECTOMY Right 11/25/2015   Procedure: ROBOTIC ASSISTED LAPAROSCOPIC  PARTIAL NEPHRECTOMY;  Surgeon: Raynelle Bring, MD;  Location: [facility];  Service: Urology;  Laterality: Right;   umblical hernia      Family History  Problem Relation Age of Onset   Stroke Mother    Hypertension Brother    Cancer Brother        kidney    Social History   Socioeconomic History   Marital status: Married    Spouse name: Not on file   Number of children: Not on file   Years of education: Not on file   Highest education level: Not on file  Occupational History   Not on file  Tobacco Use   Smoking status: Never   Smokeless  tobacco: Never  Substance and Sexual Activity   Alcohol use: No   Drug use: No   Sexual activity: Not on file  Other Topics Concern   Not on file  Social History Narrative   Not on file   Social Determinants of Health   Financial Resource Strain: Not on file  Food Insecurity: Not on file  Transportation Needs: Not on file  Physical Activity: Not on file  Stress: Not on file  Social Connections: Not on file  Intimate Partner Violence: Not on file    Outpatient Medications Prior to Visit  Medication Sig Dispense Refill   acetaminophen (TYLENOL) 500 MG tablet Take 1,000 mg by mouth daily as needed for mild pain.      escitalopram (LEXAPRO) 20 MG tablet TAKE 1 TABLET BY MOUTH EVERY DAY 30 tablet 6   fenofibrate 160 MG tablet Take 1 tablet (160 mg total) by mouth daily. 90 tablet 0   ibuprofen (ADVIL) 600 MG tablet Take 600 mg by mouth every 6 (six) hours as needed.     TURMERIC PO Take by mouth.     No facility-administered medications prior to visit.    Allergies  Allergen Reactions   Shellfish Allergy Swelling    Review of  Systems  Constitutional: Negative.   HENT:  Positive for ear pain (pressure). Negative for ear discharge.   Eyes: Negative.   Respiratory: Negative.    Cardiovascular: Negative.   Gastrointestinal: Negative.   Genitourinary: Negative.   Musculoskeletal: Negative.   Skin: Negative.   Neurological: Negative.   Psychiatric/Behavioral: Negative.    All other systems reviewed and are negative.     Objective:    Physical Exam Vitals and nursing note reviewed.  Constitutional:      Appearance: Normal appearance.  HENT:     Right Ear: Tympanic membrane, ear canal and external ear normal. There is impacted cerumen.     Left Ear: Tympanic membrane, ear canal and external ear normal. There is impacted cerumen.  Cardiovascular:     Rate and Rhythm: Normal rate and regular rhythm.     Pulses: Normal pulses.     Heart sounds: Normal heart sounds. No  murmur heard.   No friction rub. No gallop.  Pulmonary:     Effort: Pulmonary effort is normal. No respiratory distress.     Breath sounds: Normal breath sounds. No stridor. No wheezing, rhonchi or rales.  Neurological:     General: No focal deficit present.     Mental Status: He is alert. Mental status is at baseline.  Psychiatric:        Mood and Affect: Mood normal.        Behavior: Behavior normal.        Thought Content: Thought content normal.        Judgment: Judgment normal.    BP 140/74   Pulse 68   Temp 98.2 F (36.8 C) (Temporal)   Resp 18   Ht 5\' 8"  (1.727 m)   Wt 166 lb (75.3 kg)   SpO2 97%   BMI 25.24 kg/m  Wt Readings from Last 3 Encounters:  06/24/21 166 lb (75.3 kg)  11/08/20 165 lb (74.8 kg)  07/08/20 164 lb (74.4 kg)    There are no preventive care reminders to display for this patient.  There are no preventive care reminders to display for this patient.   Lab Results  Component Value Date   TSH 4.16 11/08/2020   Lab Results  Component Value Date   WBC 7.2 11/08/2020   HGB 14.7 11/08/2020   HCT 42.9 11/08/2020   MCV 90.2 11/08/2020   PLT 256.0 11/08/2020   Lab Results  Component Value Date   NA 135 11/08/2020   K 4.4 11/08/2020   CO2 28 11/08/2020   GLUCOSE 90 11/08/2020   BUN 18 11/08/2020   CREATININE 0.82 11/08/2020   BILITOT 0.5 11/08/2020   ALKPHOS 48 11/08/2020   AST 18 11/08/2020   ALT 13 11/08/2020   PROT 6.5 11/08/2020   ALBUMIN 4.2 11/08/2020   CALCIUM 9.2 11/08/2020   ANIONGAP 8 11/27/2015   GFR 91.96 11/08/2020   Lab Results  Component Value Date   CHOL 212 (H) 11/08/2020   Lab Results  Component Value Date   HDL 47.90 11/08/2020   Lab Results  Component Value Date   LDLCALC 142 (H) 11/04/2018   Lab Results  Component Value Date   TRIG 244.0 (H) 11/08/2020   Lab Results  Component Value Date   CHOLHDL 4 11/08/2020   No results found for: HGBA1C     Assessment & Plan:   Problem List Items  Addressed This Visit   None Visit Diagnoses     Acute swimmer's ear of both sides    -  Primary   Relevant Medications   ofloxacin (FLOXIN OTIC) 0.3 % OTIC solution   acetic acid-hydrocortisone (VOSOL-HC) OTIC solution        Meds ordered this encounter  Medications   ofloxacin (FLOXIN OTIC) 0.3 % OTIC solution    Sig: Place 5 drops into both ears daily.    Dispense:  5 mL    Refill:  0    Order Specific Question:   Supervising Provider    Answer:   Carlota Raspberry, JEFFREY R [[id number]]   acetic acid-hydrocortisone (VOSOL-HC) OTIC solution    Sig: Place 3 drops into both ears 3 (three) times daily as needed.    Dispense:  10 mL    Refill:  0    Order Specific Question:   Supervising Provider    Answer:   Carlota Raspberry, JEFFREY R [[id number]]   PLAN Lavage by Jerene Pitch CMA removes a good amount of wax. Canals still show irritation. Will treat as otitis externa with ofloxacin drops as above Vosol-hc for ongoing itching or irritation. If worsening or failing to improve, can consider ENT referral Return if interested in restarting anxiolytics/antidepressants Patient encouraged to call clinic with any questions, comments, or concerns.   Maximiano Coss, NP

## 2021-06-25 ENCOUNTER — Encounter: Payer: Self-pay | Admitting: *Deleted

## 2021-06-25 ENCOUNTER — Ambulatory Visit: Payer: Medicare Other | Admitting: Family Medicine

## 2021-09-17 ENCOUNTER — Other Ambulatory Visit: Payer: Self-pay | Admitting: Family Medicine

## 2021-09-28 DIAGNOSIS — Z23 Encounter for immunization: Secondary | ICD-10-CM | POA: Diagnosis not present

## 2021-10-02 DIAGNOSIS — E785 Hyperlipidemia, unspecified: Secondary | ICD-10-CM | POA: Diagnosis not present

## 2021-10-02 DIAGNOSIS — Z79899 Other long term (current) drug therapy: Secondary | ICD-10-CM | POA: Diagnosis not present

## 2021-10-02 DIAGNOSIS — Z20822 Contact with and (suspected) exposure to covid-19: Secondary | ICD-10-CM | POA: Diagnosis not present

## 2021-10-02 DIAGNOSIS — R52 Pain, unspecified: Secondary | ICD-10-CM | POA: Diagnosis not present

## 2021-10-02 DIAGNOSIS — T310 Burns involving less than 10% of body surface: Secondary | ICD-10-CM | POA: Insufficient documentation

## 2021-10-02 DIAGNOSIS — X030XXA Exposure to flames in controlled fire, not in building or structure, initial encounter: Secondary | ICD-10-CM | POA: Diagnosis not present

## 2021-10-02 DIAGNOSIS — T23302A Burn of third degree of left hand, unspecified site, initial encounter: Secondary | ICD-10-CM | POA: Diagnosis not present

## 2021-10-02 DIAGNOSIS — M79641 Pain in right hand: Secondary | ICD-10-CM | POA: Diagnosis not present

## 2021-10-02 DIAGNOSIS — R2689 Other abnormalities of gait and mobility: Secondary | ICD-10-CM | POA: Diagnosis not present

## 2021-10-02 DIAGNOSIS — T300 Burn of unspecified body region, unspecified degree: Secondary | ICD-10-CM | POA: Diagnosis not present

## 2021-10-02 DIAGNOSIS — Z23 Encounter for immunization: Secondary | ICD-10-CM | POA: Diagnosis not present

## 2021-10-02 DIAGNOSIS — T23242A Burn of second degree of multiple left fingers (nail), including thumb, initial encounter: Secondary | ICD-10-CM | POA: Diagnosis not present

## 2021-10-02 DIAGNOSIS — T23201A Burn of second degree of right hand, unspecified site, initial encounter: Secondary | ICD-10-CM | POA: Diagnosis not present

## 2021-10-03 DIAGNOSIS — X030XXA Exposure to flames in controlled fire, not in building or structure, initial encounter: Secondary | ICD-10-CM | POA: Diagnosis not present

## 2021-10-03 DIAGNOSIS — Z79899 Other long term (current) drug therapy: Secondary | ICD-10-CM | POA: Diagnosis not present

## 2021-10-03 DIAGNOSIS — Z23 Encounter for immunization: Secondary | ICD-10-CM | POA: Diagnosis not present

## 2021-10-03 DIAGNOSIS — T23201A Burn of second degree of right hand, unspecified site, initial encounter: Secondary | ICD-10-CM | POA: Diagnosis not present

## 2021-10-03 DIAGNOSIS — T310 Burns involving less than 10% of body surface: Secondary | ICD-10-CM | POA: Diagnosis not present

## 2021-10-03 DIAGNOSIS — E785 Hyperlipidemia, unspecified: Secondary | ICD-10-CM | POA: Diagnosis not present

## 2021-10-03 DIAGNOSIS — R2689 Other abnormalities of gait and mobility: Secondary | ICD-10-CM | POA: Diagnosis not present

## 2021-10-13 DIAGNOSIS — T23331D Burn of third degree of multiple right fingers (nail), not including thumb, subsequent encounter: Secondary | ICD-10-CM | POA: Diagnosis not present

## 2021-10-13 DIAGNOSIS — T310 Burns involving less than 10% of body surface: Secondary | ICD-10-CM | POA: Diagnosis not present

## 2021-10-13 DIAGNOSIS — L905 Scar conditions and fibrosis of skin: Secondary | ICD-10-CM | POA: Diagnosis not present

## 2021-10-13 DIAGNOSIS — T23241A Burn of second degree of multiple right fingers (nail), including thumb, initial encounter: Secondary | ICD-10-CM | POA: Diagnosis not present

## 2021-10-13 DIAGNOSIS — T23241D Burn of second degree of multiple right fingers (nail), including thumb, subsequent encounter: Secondary | ICD-10-CM | POA: Diagnosis not present

## 2021-10-13 DIAGNOSIS — T23331A Burn of third degree of multiple right fingers (nail), not including thumb, initial encounter: Secondary | ICD-10-CM | POA: Diagnosis not present

## 2021-10-13 DIAGNOSIS — T23241S Burn of second degree of multiple right fingers (nail), including thumb, sequela: Secondary | ICD-10-CM | POA: Diagnosis not present

## 2021-10-13 DIAGNOSIS — Z6824 Body mass index (BMI) 24.0-24.9, adult: Secondary | ICD-10-CM | POA: Diagnosis not present

## 2021-10-21 DIAGNOSIS — Z6824 Body mass index (BMI) 24.0-24.9, adult: Secondary | ICD-10-CM | POA: Diagnosis not present

## 2021-10-21 DIAGNOSIS — T23231A Burn of second degree of multiple right fingers (nail), not including thumb, initial encounter: Secondary | ICD-10-CM | POA: Insufficient documentation

## 2021-10-21 DIAGNOSIS — T310 Burns involving less than 10% of body surface: Secondary | ICD-10-CM | POA: Diagnosis not present

## 2021-10-21 DIAGNOSIS — T23231D Burn of second degree of multiple right fingers (nail), not including thumb, subsequent encounter: Secondary | ICD-10-CM | POA: Diagnosis not present

## 2021-11-04 DIAGNOSIS — T310 Burns involving less than 10% of body surface: Secondary | ICD-10-CM | POA: Diagnosis not present

## 2021-11-04 DIAGNOSIS — Z6823 Body mass index (BMI) 23.0-23.9, adult: Secondary | ICD-10-CM | POA: Diagnosis not present

## 2021-11-04 DIAGNOSIS — T23241D Burn of second degree of multiple right fingers (nail), including thumb, subsequent encounter: Secondary | ICD-10-CM | POA: Diagnosis not present

## 2021-11-04 DIAGNOSIS — T23241A Burn of second degree of multiple right fingers (nail), including thumb, initial encounter: Secondary | ICD-10-CM | POA: Diagnosis not present

## 2021-12-10 ENCOUNTER — Telehealth: Payer: Medicare Other | Admitting: Physician Assistant

## 2021-12-10 DIAGNOSIS — U071 COVID-19: Secondary | ICD-10-CM | POA: Diagnosis not present

## 2021-12-10 MED ORDER — MOLNUPIRAVIR EUA 200MG CAPSULE: 4.0000 | ORAL_CAPSULE | Freq: Two times a day (BID) | ORAL | 0 refills | Status: AC

## 2021-12-10 NOTE — Progress Notes (Signed)
Virtual Visit Consent   Louis Fox, you are scheduled for a virtual visit with a [facility] provider today.     Just as with appointments in the office, your consent must be obtained to participate.  Your consent will be active for this visit and any virtual visit you may have with one of our providers in the next 365 days.     If you have a MyChart account, a copy of this consent can be sent to you electronically.  All virtual visits are billed to your insurance company just like a traditional visit in the office.    As this is a virtual visit, video technology does not allow for your provider to perform a traditional examination.  This may limit your provider's ability to fully assess your condition.  If your provider identifies any concerns that need to be evaluated in person or the need to arrange testing (such as labs, EKG, etc.), we will make arrangements to do so.     Although advances in technology are sophisticated, we cannot ensure that it will always work on either your end or our end.  If the connection with a video visit is poor, the visit may have to be switched to a telephone visit.  With either a video or telephone visit, we are not always able to ensure that we have a secure connection.     I need to obtain your verbal consent now.   Are you willing to proceed with your visit today?    Louis Fox has provided verbal consent on 12/10/2021 for a virtual visit (video or telephone).   Leeanne Rio, [place]   Date: 12/10/2021 4:01 PM   Virtual Visit via Video Note   I, Leeanne Rio, connected with  Louis Fox  ([id number], [birth year]) on 12/10/21 at  4:15 PM EST by a video-enabled telemedicine application and verified that I am speaking with the correct person using two identifiers.  Location: Patient: Virtual Visit Location Patient: Home Provider: Virtual Visit Location Provider: Home Office   I discussed the limitations of evaluation and  management by telemedicine and the availability of in person appointments. The patient expressed understanding and agreed to proceed.    History of Present Illness: Louis Fox is a 65 y.o. who identifies as a male who was assigned male at birth, and is being seen today for COVID-19. Notes symptoms starting in past 48 hours ago with nasal and head congestion and cough. 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 Found out someone he was around this past weekend had tested positive for COVID-19. As such he took a test today which came back positive. Notes symptoms are steady but not worsening thus far. Has continued to have head and chest congestion, now with some mild hoarseness. Denies fever, chills or body aches. Is able to move around without any chest pain or SOB.  Has not had COVID before.   HPI: HPI  Problems:  Patient Active Problem List   Diagnosis Date Noted   Hyperlipidemia 11/07/2019   Anxiety 07/08/2018   Carcinoma, renal cell, right ([place]) 07/08/2018   Physical exam 05/19/2017   Duodenitis 09/25/2015   Abdominal pain 09/25/2015    Allergies:  Allergies  Allergen Reactions   Shellfish Allergy Swelling   Medications:  Current Outpatient Medications:    molnupiravir EUA (LAGEVRIO) 200 mg CAPS capsule, Take 4 capsules (800 mg total) by mouth 2 (two) times  daily for 5 days., Disp: 40 capsule, Rfl: 0   acetaminophen (TYLENOL) 500 MG tablet, Take 1,000 mg by mouth daily as needed for mild pain. , Disp: , Rfl:    fenofibrate 160 MG tablet, TAKE 1 TABLET BY MOUTH EVERY DAY, Disp: 90 tablet, Rfl: 0   ibuprofen (ADVIL) 600 MG tablet, Take 600 mg by mouth every 6 (six) hours as needed., Disp: , Rfl:    TURMERIC PO, Take by mouth., Disp: , Rfl:   Observations/Objective: Patient is well-developed, well-nourished in no acute distress.  Resting comfortably at home.  Head is normocephalic, atraumatic.  No labored breathing. Speech is clear and coherent  with logical content.  Patient is alert and oriented at baseline.   Assessment and Plan: 1. COVID-19 - MyChart COVID-19 home monitoring program; Future - molnupiravir EUA (LAGEVRIO) 200 mg CAPS capsule; Take 4 capsules (800 mg total) by mouth 2 (two) times daily for 5 days.  Dispense: 40 capsule; Refill: 0 Discussed risks/benefits of antiviral medications including most common potential ADRs. Patient voiced understanding and would like to proceed with antiviral medication. They are candidate for molnupiravir. Rx sent to pharmacy. Supportive measures, OTC medications and vitamin regimen reviewed.. Patient has been enrolled in a MyChart COVID symptom monitoring program. Samule Dry reviewed in detail. Strict ER precautions discussed with patient.    Follow Up Instructions: I discussed the assessment and treatment plan with the patient. The patient was provided an opportunity to ask questions and all were answered. The patient agreed with the plan and demonstrated an understanding of the instructions.  A copy of instructions were sent to the patient via MyChart unless otherwise noted below.   The patient was advised to call back or seek an in-person evaluation if the symptoms worsen or if the condition fails to improve as anticipated.  Time:  I spent 10 minutes with the patient via telehealth technology discussing the above problems/concerns.    Leeanne Rio, PA-C

## 2021-12-10 NOTE — Patient Instructions (Signed)
Louis Fox, thank you for joining Leeanne Rio, PA-C for today's virtual visit.  While this provider is not your primary care provider (PCP), if your PCP is located in our provider database this encounter information will be shared with them immediately following your visit.  Consent: (Patient) Louis Fox provided verbal consent for this virtual visit at the beginning of the encounter.  Current Medications:  Current Outpatient Medications:    acetaminophen (TYLENOL) 500 MG tablet, Take 1,000 mg by mouth daily as needed for mild pain. , Disp: , Rfl:    acetic acid-hydrocortisone (VOSOL-HC) OTIC solution, Place 3 drops into both ears 3 (three) times daily as needed., Disp: 10 mL, Rfl: 0   fenofibrate 160 MG tablet, TAKE 1 TABLET BY MOUTH EVERY DAY, Disp: 90 tablet, Rfl: 0   ibuprofen (ADVIL) 600 MG tablet, Take 600 mg by mouth every 6 (six) hours as needed., Disp: , Rfl:    ofloxacin (FLOXIN OTIC) 0.3 % OTIC solution, Place 5 drops into both ears daily., Disp: 5 mL, Rfl: 0   TURMERIC PO, Take by mouth., Disp: , Rfl:    Medications ordered in this encounter:  No orders of the defined types were placed in this encounter.    *If you need refills on other medications prior to your next appointment, please contact your pharmacy*  Follow-Up: Call back or seek an in-person evaluation if the symptoms worsen or if the condition fails to improve as anticipated.  Other Instructions Please keep well-hydrated and get plenty of rest. Start a saline nasal rinse to flush out your nasal passages. You can use plain Mucinex to help thin congestion. If you have a humidifier, running in the bedroom at night. I want you to start OTC vitamin D3 1000 units daily, vitamin C 1000 mg daily, and a zinc supplement. Please take prescribed medications as directed.  You have been enrolled in a MyChart symptom monitoring program. Please answer these questions daily so we can keep track of how you  are doing.  You were to quarantine for 5 days from onset of your symptoms.  After day 5, if you have had no fever and you are feeling better, you can end quarantine but need to mask for an additional 5 days. After day 5 if you have a fever or are having significant symptoms, please quarantine for full 10 days.  If you note any worsening of symptoms, any significant shortness of breath or any chest pain, please seek ER evaluation ASAP.  Please do not delay care!  COVID-19: What to Do if You Are Sick If you test positive and are an older adult or someone who is at high risk of getting very sick from COVID-19, treatment may be available. Contact a healthcare provider right away after a positive test to determine if you are eligible, even if your symptoms are mild right now. You can also visit a Test to Treat location and, if eligible, receive a prescription from a provider. Don't delay: Treatment must be started within the first few days to be effective. If you have a fever, cough, or other symptoms, you might have COVID-19. Most people have mild illness and are able to recover at home. If you are sick: Keep track of your symptoms. If you have an emergency warning sign (including trouble breathing), call 911. Steps to help prevent the spread of COVID-19 if you are sick If you are sick with COVID-19 or think you might have COVID-19, follow the steps  below to care for yourself and to help protect other people in your home and community. Stay home except to get medical care Stay home. Most people with COVID-19 have mild illness and can recover at home without medical care. Do not leave your home, except to get medical care. Do not visit public areas and do not go to places where you are unable to wear a mask. Take care of yourself. Get rest and stay hydrated. Take over-the-counter medicines, such as acetaminophen, to help you feel better. Stay in touch with your doctor. Call before you get medical care.  Be sure to get care if you have trouble breathing, or have any other emergency warning signs, or if you think it is an emergency. Avoid public transportation, ride-sharing, or taxis if possible. Get tested If you have symptoms of COVID-19, get tested. While waiting for test results, stay away from others, including staying apart from those living in your household. Get tested as soon as possible after your symptoms start. Treatments may be available for people with COVID-19 who are at risk for becoming very sick. Don't delay: Treatment must be started early to be effective--some treatments must begin within 5 days of your first symptoms. Contact your healthcare provider right away if your test result is positive to determine if you are eligible. Self-tests are one of several options for testing for the virus that causes COVID-19 and may be more convenient than laboratory-based tests and point-of-care tests. Ask your healthcare provider or your local health department if you need help interpreting your test results. You can visit your state, tribal, local, and territorial health department's website to look for the latest local information on testing sites. Separate yourself from other people As much as possible, stay in a specific room and away from other people and pets in your home. If possible, you should use a separate bathroom. If you need to be around other people or animals in or outside of the home, wear a well-fitting mask. Tell your close contacts that they may have been exposed to COVID-19. An infected person can spread COVID-19 starting 48 hours (or 2 days) before the person has any symptoms or tests positive. By letting your close contacts know they may have been exposed to COVID-19, you are helping to protect everyone. See COVID-19 and Animals if you have questions about pets. If you are diagnosed with COVID-19, someone from the health department may call you. Answer the call to slow the  spread. Monitor your symptoms Symptoms of COVID-19 include fever, cough, or other symptoms. Follow care instructions from your healthcare provider and local health department. Your local health authorities may give instructions on checking your symptoms and reporting information. When to seek emergency medical attention Look for emergency warning signs* for COVID-19. If someone is showing any of these signs, seek emergency medical care immediately: Trouble breathing Persistent pain or pressure in the chest New confusion Inability to wake or stay awake Pale, gray, or blue-colored skin, lips, or nail beds, depending on skin tone *This list is not all possible symptoms. Please call your medical provider for any other symptoms that are severe or concerning to you. Call 911 or call ahead to your local emergency facility: Notify the operator that you are seeking care for someone who has or may have COVID-19. Call ahead before visiting your doctor Call ahead. Many medical visits for routine care are being postponed or done by phone or telemedicine. If you have a medical appointment that cannot  be postponed, call your doctor's office, and tell them you have or may have COVID-19. This will help the office protect themselves and other patients. If you are sick, wear a well-fitting mask You should wear a mask if you must be around other people or animals, including pets (even at home). Wear a mask with the best fit, protection, and comfort for you. You don't need to wear the mask if you are alone. If you can't put on a mask (because of trouble breathing, for example), cover your coughs and sneezes in some other way. Try to stay at least 6 feet away from other people. This will help protect the people around you. Masks should not be placed on young children under age 80 years, anyone who has trouble breathing, or anyone who is not able to remove the mask without help. Cover your coughs and sneezes Cover  your mouth and nose with a tissue when you cough or sneeze. Throw away used tissues in a lined trash can. Immediately wash your hands with soap and water for at least 20 seconds. If soap and water are not available, clean your hands with an alcohol-based hand sanitizer that contains at least 60% alcohol. Clean your hands often Wash your hands often with soap and water for at least 20 seconds. This is especially important after blowing your nose, coughing, or sneezing; going to the bathroom; and before eating or preparing food. Use hand sanitizer if soap and water are not available. Use an alcohol-based hand sanitizer with at least 60% alcohol, covering all surfaces of your hands and rubbing them together until they feel dry. Soap and water are the best option, especially if hands are visibly dirty. Avoid touching your eyes, nose, and mouth with unwashed hands. Handwashing Tips Avoid sharing personal household items Do not share dishes, drinking glasses, cups, eating utensils, towels, or bedding with other people in your home. Wash these items thoroughly after using them with soap and water or put in the dishwasher. Clean surfaces in your home regularly Clean and disinfect high-touch surfaces (for example, doorknobs, tables, handles, light switches, and countertops) in your "sick room" and bathroom. In shared spaces, you should clean and disinfect surfaces and items after each use by the person who is ill. If you are sick and cannot clean, a caregiver or other person should only clean and disinfect the area around you (such as your bedroom and bathroom) on an as needed basis. Your caregiver/other person should wait as long as possible (at least several hours) and wear a mask before entering, cleaning, and disinfecting shared spaces that you use. Clean and disinfect areas that may have blood, stool, or body fluids on them. Use household cleaners and disinfectants. Clean visible dirty surfaces with  household cleaners containing soap or detergent. Then, use a household disinfectant. Use a product from [employer] List N: Disinfectants for Coronavirus (BZJIR-67). Be sure to follow the instructions on the label to ensure safe and effective use of the product. Many products recommend keeping the surface wet with a disinfectant for a certain period of time (look at "contact time" on the product label). You may also need to wear personal protective equipment, such as gloves, depending on the directions on the product label. Immediately after disinfecting, wash your hands with soap and water for 20 seconds. For completed guidance on cleaning and disinfecting your home, visit Complete Disinfection Guidance. Take steps to improve ventilation at home Improve ventilation (air flow) at home to help prevent from  spreading COVID-19 to other people in your household. Clear out COVID-19 virus particles in the air by opening windows, using air filters, and turning on fans in your home. Use this interactive tool to learn how to improve air flow in your home. When you can be around others after being sick with COVID-19 Deciding when you can be around others is different for different situations. Find out when you can safely end home isolation. For any additional questions about your care, contact your healthcare provider or state or local health department. 03/18/2021 Content source: [facility] ([facility]), Division of Viral Diseases This information is not intended to replace advice given to you by your health care provider. Make sure you discuss any questions you have with your health care provider. Document Revised: 05/01/2021 Document Reviewed: 05/01/2021 Elsevier Patient Education  2022 [employer].       If you have been instructed to have an in-person evaluation today at a local Urgent Care facility, please use the link below. It will take you to a list of all  of our available [facility], including address, phone number and hours of operation. Please do not delay care.  [facility]  If you or a family member do not have a primary care provider, use the link below to schedule a visit and establish care. When you choose a [facility] primary care physician or advanced practice provider, you gain a long-term partner in health. Find a Primary Care Provider  Learn more about [facility]'s in-office and virtual care options: [facility]

## 2021-12-11 ENCOUNTER — Telehealth: Payer: Medicare Other | Admitting: Registered Nurse

## 2021-12-17 ENCOUNTER — Other Ambulatory Visit: Payer: Self-pay | Admitting: Family Medicine

## 2021-12-17 ENCOUNTER — Telehealth: Payer: Self-pay | Admitting: Family Medicine

## 2021-12-17 NOTE — Telephone Encounter (Signed)
Left message for patient to call back and schedule Medicare Annual Wellness Visit (AWV) in office.   If not able to come in office, please offer to do virtually or by telephone.  Left office number and my jabber [phone].  Last AWV:11/08/2020  Please schedule at anytime with Nurse Health Advisor.

## 2021-12-24 DIAGNOSIS — T310 Burns involving less than 10% of body surface: Secondary | ICD-10-CM | POA: Diagnosis not present

## 2021-12-24 DIAGNOSIS — T23331D Burn of third degree of multiple right fingers (nail), not including thumb, subsequent encounter: Secondary | ICD-10-CM | POA: Diagnosis not present

## 2021-12-24 DIAGNOSIS — T23241D Burn of second degree of multiple right fingers (nail), including thumb, subsequent encounter: Secondary | ICD-10-CM | POA: Diagnosis not present

## 2021-12-24 DIAGNOSIS — Z6824 Body mass index (BMI) 24.0-24.9, adult: Secondary | ICD-10-CM | POA: Diagnosis not present

## 2022-01-28 ENCOUNTER — Ambulatory Visit (INDEPENDENT_AMBULATORY_CARE_PROVIDER_SITE_OTHER): Payer: Medicare Other | Admitting: Family Medicine

## 2022-01-28 ENCOUNTER — Encounter: Payer: Self-pay | Admitting: Family Medicine

## 2022-01-28 VITALS — BP 126/78 | HR 71 | Temp 98.2°F | Resp 16 | Ht 68.0 in | Wt 167.0 lb

## 2022-01-28 DIAGNOSIS — E785 Hyperlipidemia, unspecified: Secondary | ICD-10-CM | POA: Diagnosis not present

## 2022-01-28 DIAGNOSIS — Z125 Encounter for screening for malignant neoplasm of prostate: Secondary | ICD-10-CM | POA: Diagnosis not present

## 2022-01-28 DIAGNOSIS — Z789 Other specified health status: Secondary | ICD-10-CM

## 2022-01-28 DIAGNOSIS — T23331A Burn of third degree of multiple right fingers (nail), not including thumb, initial encounter: Secondary | ICD-10-CM

## 2022-01-28 DIAGNOSIS — E663 Overweight: Secondary | ICD-10-CM

## 2022-01-28 DIAGNOSIS — F1090 Alcohol use, unspecified, uncomplicated: Secondary | ICD-10-CM

## 2022-01-28 DIAGNOSIS — K859 Acute pancreatitis without necrosis or infection, unspecified: Secondary | ICD-10-CM | POA: Insufficient documentation

## 2022-01-28 LAB — CBC WITH DIFFERENTIAL/PLATELET
Basophils Absolute: 0.1 10*3/uL (ref 0.0–0.1)
Basophils Relative: 1.4 % (ref 0.0–3.0)
Eosinophils Absolute: 0.6 10*3/uL (ref 0.0–0.7)
Eosinophils Relative: 7.7 % — ABNORMAL HIGH (ref 0.0–5.0)
HCT: 40.4 % (ref 39.0–52.0)
Hemoglobin: 13.8 g/dL (ref 13.0–17.0)
Lymphocytes Relative: 19.1 % (ref 12.0–46.0)
Lymphs Abs: 1.4 10*3/uL (ref 0.7–4.0)
MCHC: 34.1 g/dL (ref 30.0–36.0)
MCV: 88.8 fl (ref 78.0–100.0)
Monocytes Absolute: 0.5 10*3/uL (ref 0.1–1.0)
Monocytes Relative: 6.5 % (ref 3.0–12.0)
Neutro Abs: 4.6 10*3/uL (ref 1.4–7.7)
Neutrophils Relative %: 65.3 % (ref 43.0–77.0)
Platelets: 332 10*3/uL (ref 150.0–400.0)
RBC: 4.55 Mil/uL (ref 4.22–5.81)
RDW: 12.7 % (ref 11.5–15.5)
WBC: 7.1 10*3/uL (ref 4.0–10.5)

## 2022-01-28 LAB — HEPATIC FUNCTION PANEL
ALT: 8 U/L (ref 0–53)
AST: 14 U/L (ref 0–37)
Albumin: 4.3 g/dL (ref 3.5–5.2)
Alkaline Phosphatase: 48 U/L (ref 39–117)
Bilirubin, Direct: 0.1 mg/dL (ref 0.0–0.3)
Total Bilirubin: 0.5 mg/dL (ref 0.2–1.2)
Total Protein: 6.7 g/dL (ref 6.0–8.3)

## 2022-01-28 LAB — LIPID PANEL
Cholesterol: 146 mg/dL (ref 0–200)
HDL: 45.8 mg/dL (ref >39.00)
LDL Cholesterol: 76 mg/dL (ref 0–99)
NonHDL: 100.62
Total CHOL/HDL Ratio: 3
Triglycerides: 121 mg/dL (ref 0.0–149.0)
VLDL: 24.2 mg/dL (ref 0.0–40.0)

## 2022-01-28 LAB — BASIC METABOLIC PANEL
BUN: 22 mg/dL (ref 6–23)
CO2: 29 mEq/L (ref 19–32)
Calcium: 10 mg/dL (ref 8.4–10.5)
Chloride: 102 mEq/L (ref 96–112)
Creatinine, Ser: 1 mg/dL (ref 0.40–1.50)
GFR: 78.12 mL/min (ref >60.00)
Glucose, Bld: 112 mg/dL — ABNORMAL HIGH (ref 70–99)
Potassium: 4.1 mEq/L (ref 3.5–5.1)
Sodium: 138 mEq/L (ref 135–145)

## 2022-01-28 LAB — TSH: TSH: 4.63 u[IU]/mL (ref 0.35–5.50)

## 2022-01-28 LAB — PSA, MEDICARE: PSA: 2.11 ng/ml (ref 0.10–4.00)
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
## 2022-01-28 NOTE — Assessment & Plan Note (Signed)
Chronic problem.  On Fenofibrate 160mg  daily.  Currently asymptomatic.  Check labs.  Adjust meds prn

## 2022-01-28 NOTE — Assessment & Plan Note (Signed)
Current BMI is 25.39.  He is riding his bike regularly and hiking.  He has a torn meniscus in his R knee so he needs to watch his type of exercise and how much.  Encouraged him to eat a healthy diet.  Will follow.
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
## 2022-01-28 NOTE — Patient Instructions (Signed)
Follow up in 1 year or as needed We'll notify you of your lab results and make any changes if needed Continue to work on healthy diet and regular exercise- you can do it! I'm proud of you for treating your anxiety!!! Call with any questions or concerns Stay Safe!  Stay Healthy! Happy New Year!!

## 2022-01-29 ENCOUNTER — Telehealth: Payer: Self-pay

## 2022-01-29 NOTE — Telephone Encounter (Signed)
Patient aware of labs.  

## 2022-01-29 NOTE — Telephone Encounter (Signed)
-----   Message from Midge Minium, MD sent at 01/29/2022  7:43 AM EST ----- Labs look great!  No changes at this time

## 2022-02-10 DIAGNOSIS — Z6824 Body mass index (BMI) 24.0-24.9, adult: Secondary | ICD-10-CM | POA: Diagnosis not present

## 2022-02-10 DIAGNOSIS — T23241S Burn of second degree of multiple right fingers (nail), including thumb, sequela: Secondary | ICD-10-CM | POA: Diagnosis not present

## 2022-02-10 DIAGNOSIS — T310 Burns involving less than 10% of body surface: Secondary | ICD-10-CM | POA: Diagnosis not present

## 2022-02-10 DIAGNOSIS — R600 Localized edema: Secondary | ICD-10-CM | POA: Diagnosis not present

## 2022-02-10 DIAGNOSIS — T23241D Burn of second degree of multiple right fingers (nail), including thumb, subsequent encounter: Secondary | ICD-10-CM | POA: Diagnosis not present

## 2022-02-10 DIAGNOSIS — L910 Hypertrophic scar: Secondary | ICD-10-CM | POA: Diagnosis not present

## 2022-03-06 ENCOUNTER — Other Ambulatory Visit: Payer: Self-pay | Admitting: Family Medicine

## 2022-04-01 DIAGNOSIS — M9901 Segmental and somatic dysfunction of cervical region: Secondary | ICD-10-CM | POA: Diagnosis not present

## 2022-04-01 DIAGNOSIS — M9902 Segmental and somatic dysfunction of thoracic region: Secondary | ICD-10-CM | POA: Diagnosis not present

## 2022-04-01 DIAGNOSIS — M531 Cervicobrachial syndrome: Secondary | ICD-10-CM | POA: Diagnosis not present

## 2022-04-01 DIAGNOSIS — M5032 Other cervical disc degeneration, mid-cervical region, unspecified level: Secondary | ICD-10-CM | POA: Diagnosis not present

## 2022-04-02 ENCOUNTER — Ambulatory Visit (INDEPENDENT_AMBULATORY_CARE_PROVIDER_SITE_OTHER): Payer: Medicare Other | Admitting: Family

## 2022-04-02 ENCOUNTER — Encounter: Payer: Self-pay | Admitting: Family

## 2022-04-02 VITALS — BP 116/75 | HR 86 | Temp 98.6°F | Ht 68.0 in | Wt 167.2 lb

## 2022-04-02 DIAGNOSIS — J4521 Mild intermittent asthma with (acute) exacerbation: Secondary | ICD-10-CM | POA: Diagnosis not present

## 2022-04-02 DIAGNOSIS — R0789 Other chest pain: Secondary | ICD-10-CM

## 2022-04-02 MED ORDER — ALBUTEROL SULFATE HFA 108 (90 BASE) MCG/ACT IN AERS: 2.0000 | INHALATION_SPRAY | Freq: Four times a day (QID) | RESPIRATORY_TRACT | 0 refills | Status: DC | PRN

## 2022-04-02 NOTE — Patient Instructions (Signed)
It was very nice to see you today! ? ?I have sent an albuterol inhaler to your pharmacy to use for your shortness of breath and chest tightness. Use 1 puff hold for 5 seconds, then do a 2nd puff and hold 5 seconds, do this as soon as you get home, at bedtime and when you wake up in the morning. Use it 2-3 times per day for the next 3 days to see if those symptoms get better, then just as needed. ?Drink at least 2 liters of water daily! ?OK to take 1 generic Aleve twice a day, or can try Advil DualAction (tylenol & advil) 2-3 pills 3 times per day for pain. ? ? ? ?PLEASE NOTE: ? ?If you had any lab tests please let us know if you have not heard back within a few days. You may see your results on MyChart before we have a chance to review them but we will give you a call once they are reviewed by [place]. If we ordered any referrals today, please let us know if you have not heard from their office within the next week.  ? ?
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
## 2022-04-29 ENCOUNTER — Other Ambulatory Visit: Payer: Self-pay | Admitting: Family

## 2022-04-29 DIAGNOSIS — J4521 Mild intermittent asthma with (acute) exacerbation: Secondary | ICD-10-CM

## 2022-06-03 ENCOUNTER — Other Ambulatory Visit: Payer: Self-pay | Admitting: Family Medicine

## 2022-06-03 ENCOUNTER — Other Ambulatory Visit: Payer: Self-pay

## 2022-07-24 ENCOUNTER — Telehealth: Payer: Self-pay | Admitting: Family Medicine

## 2022-07-24 NOTE — Telephone Encounter (Signed)
Left message for patient to call back and schedule Medicare Annual Wellness Visit (AWV).   Please offer to do virtually or by telephone.  Left office number and my jabber [phone].  Welcome to Medicare: 11/08/2020  Please schedule at anytime with Nurse Health Advisor.

## 2022-08-26 ENCOUNTER — Ambulatory Visit (INDEPENDENT_AMBULATORY_CARE_PROVIDER_SITE_OTHER): Payer: Medicare Other

## 2022-08-26 VITALS — Ht 67.0 in | Wt 167.0 lb

## 2022-08-26 DIAGNOSIS — Z0000 Encounter for general adult medical examination without abnormal findings: Secondary | ICD-10-CM | POA: Diagnosis not present

## 2022-08-26 NOTE — Progress Notes (Signed)
Subjective:   Louis Fox is a 66 y.o. male who presents for Medicare Annual/Subsequent preventive examination.   .Virtual Visit via Telephone Note  I connected with  Louis Fox on 08/26/22 at  2:30 PM EDT by telephone and verified that I am speaking with the correct person using two identifiers.  Location: Patient: Home  Provider: [facility]  Persons participating in the virtual visit: patient/Nurse Health Advisor   I discussed the limitations, risks, security and privacy concerns of performing an evaluation and management service by telephone and the availability of in person appointments. The patient expressed understanding and agreed to proceed.  Interactive audio and video telecommunications were attempted between this nurse and patient, however failed, due to patient having technical difficulties OR patient did not have access to video capability.  We continued and completed visit with audio only.  Some vital signs may be absent or patient reported.   Louis Shepherd, LPN  Review of Systems     Cardiac Risk Factors include: advanced age (>72mn, >>65women);male gender     Objective:    Today's Vitals   08/26/22 1429  Weight: 167 lb (75.8 kg)  Height: '5\' 7"'$  (1.702 m)   Body mass index is 26.16 kg/m.     08/26/2022    2:34 PM 11/25/2015    4:00 PM 11/25/2015    9:53 AM 11/07/2015   10:20 AM 09/25/2015    1:30 AM 09/24/2015    2:32 PM 01/31/2015   12:38 PM  Advanced Directives  Does Patient Have a Medical Advance Directive? Yes Yes Yes No No No No  Type of AParamedicof AVeronaLiving will H[place]unawiliLiving will HWesl[place]Living will      Does patient want to make changes to medical advance directive?  No - Patient declined No - Patient declined      Copy of [place] Chart? No - copy requested  Yes      Would patient like information on creating a medical advance  directive?  No - patient declined information  No - patient declined information No - patient declined information No - patient declined information No - patient declined information    Current Medications (verified) Outpatient Encounter Medications as of 08/26/2022  Medication Sig   acetaminophen (TYLENOL) 500 MG tablet Take 1,000 mg by mouth daily as needed for mild pain.   albuterol (VENTOLIN HFA) 108 (90 Base) MCG/ACT inhaler Inhale 2 puffs into the lungs every 6 (six) hours as needed for wheezing or shortness of breath.   fenofibrate 160 MG tablet TAKE 1 TABLET BY MOUTH EVERY DAY   ibuprofen (ADVIL) 600 MG tablet Take 600 mg by mouth every 6 (six) hours as needed.   TURMERIC PO Take by mouth.   No facility-administered encounter medications on file as of 08/26/2022.    Allergies (verified) Shellfish allergy   History: Past Medical History:  Diagnosis Date   Arthritis    Family history of punctured lung    patient with hx of puncture lung as a teenager - left lung    GERD (gastroesophageal reflux disease)    hx of   History of kidney cancer    Pancreatitis    Pseudogout    Past Surgical History:  Procedure Laterality Date   ESOPHAGOGASTRODUODENOSCOPY N/A 09/28/2015   Procedure: ESOPHAGOGASTRODUODENOSCOPY (EGD);  Surgeon: RInda Castle MD;  [facility]  Service: Endoscopy;  Laterality: N/A;  ROBOT ASSISTED LAPAROSCOPIC NEPHRECTOMY Right 11/25/2015   Procedure: ROBOTIC ASSISTED LAPAROSCOPIC  PARTIAL NEPHRECTOMY;  Surgeon: Raynelle Bring, MD;  Location: [facility];  Service: Urology;  Laterality: Right;   umblical hernia     Family History  Problem Relation Age of Onset   Stroke Mother    Hypertension Brother    Cancer Brother        kidney   Social History   Socioeconomic History   Marital status: Married    Spouse name: Not on file   Number of children: Not on file   Years of education: Not on file   Highest education level: Not on file  Occupational  History   Not on file  Tobacco Use   Smoking status: Never   Smokeless tobacco: Never  Substance and Sexual Activity   Alcohol use: No   Drug use: No   Sexual activity: Not on file  Other Topics Concern   Not on file  Social History Narrative   Not on file   Social Determinants of Health   Financial Resource Strain: Not on file  Food Insecurity: Not on file  Transportation Needs: Not on file  Physical Activity: Sufficiently Active (08/26/2022)   Exercise Vital Sign    Days of Exercise per Week: 3 days    Minutes of Exercise per Session: 60 min  Stress: Not on file  Social Connections: Not on file    Tobacco Counseling Counseling given: Not Answered   Clinical Intake:  Pre-visit preparation completed: Yes  Pain : No/denies pain     Diabetes: No  How often do you need to have someone help you when you read instructions, pamphlets, or other written materials from your doctor or pharmacy?: 1 - Never What is the last grade level you completed in school?: college  Diabetic?no   Interpreter Needed?: No  Information entered by :: L.Wilson,LPN   Activities of Daily Living    08/26/2022    2:34 PM 01/28/2022   10:00 AM  In your present state of health, do you have any difficulty performing the following activities:  Hearing? 0 0  Vision? 0 0  Difficulty concentrating or making decisions? 0 0  Walking or climbing stairs? 0 0  Dressing or bathing? 0 0  Doing errands, shopping? 0 0  Preparing Food and eating ? N   Using the Toilet? N   In the past six months, have you accidently leaked urine? N   Do you have problems with loss of bowel control? N   Managing your Medications? N   Managing your Finances? N   Housekeeping or managing your Housekeeping? N     Patient Care Team: Midge Minium, MD as PCP - General (Family Medicine) Raynelle Bring, MD as Consulting Physician (Urology) Carol Ada, MD as Consulting Physician (Gastroenterology)  Indicate any  recent Medical Services you may have received from other than [facility] providers in the past year (date may be approximate).     Assessment:   This is a routine wellness examination for [name].  Hearing/Vision screen Vision Screening - Comments:: Annual eye exams   Dietary issues and exercise activities discussed:     Goals Addressed             This Visit's Progress    DIET - INCREASE WATER INTAKE         Depression Screen    08/26/2022    2:33 PM 01/28/2022   10:01 AM 06/24/2021   10:30 AM 11/08/2020  9:04 AM 11/07/2019   10:03 AM 11/04/2018   11:01 AM 08/10/2018   10:51 AM  PHQ 2/9 Scores  PHQ - 2 Score 0 0 0 0 0 1 2  PHQ- 9 Score  0  0 0 2 3    Fall Risk    08/26/2022    2:31 PM 01/28/2022   10:01 AM 06/24/2021   10:29 AM 11/08/2020    9:04 AM 11/07/2019   10:03 AM  Fall Risk   Falls in the past year? 0 1 0 0 0  Number falls in past yr: 0 0 0 0 0  Injury with Fall? 0 1 0 0 0  Risk for fall due to : No Fall Risks History of fall(s)  No Fall Risks   Follow up Falls prevention discussed Falls evaluation completed Falls evaluation completed  Falls evaluation completed    [place]:  Any stairs in or around the home? Yes  If so, are there any without handrails? No  Home free of loose throw rugs in walkways, pet beds, electrical cords, etc? Yes  Adequate lighting in your home to reduce risk of falls? Yes   ASSISTIVE DEVICES UTILIZED TO PREVENT FALLS:  Life alert? No  Use of a cane, walker or w/c? No  Grab bars in the bathroom? No  Shower chair or bench in shower? No  Elevated toilet seat or a handicapped toilet? No          08/26/2022    2:34 PM 11/08/2020    9:23 AM  6CIT Screen  What Year? 0 points 0 points  What month? 0 points 0 points  What time? 0 points 0 points  Count back from 20 0 points 0 points  Months in reverse 0 points 0 points  Repeat phrase 0 points 0 points  Total Score 0 points 0 points     Immunizations Immunization History  Administered Date(s) Administered   Fluad Quad(high Dose 65+) 11/01/2020   Influenza,inj,Quad PF,6+ Mos 11/04/2018, 11/07/2019, 10/03/2021   Moderna Sars-Covid-2 Vaccination 01/28/2020, 02/25/2020, 11/01/2020   PNEUMOCOCCAL CONJUGATE-20 10/03/2021   Tdap 12/17/2014   Zoster Recombinat (Shingrix) 03/21/2020    TDAP status: Up to date  Flu Vaccine status: Up to date  Pneumococcal vaccine status: Up to date  Covid-19 vaccine status: Completed vaccines  Qualifies for Shingles Vaccine? Yes   Zostavax completed Yes   Shingrix Completed?: Yes  Screening Tests Health Maintenance  Topic Date Due   Zoster Vaccines- Shingrix (2 of 2) 05/16/2020   COVID-19 Vaccine (4 - Moderna risk series) 12/27/2020   INFLUENZA VACCINE  07/28/2022   TETANUS/TDAP  12/17/2024   COLONOSCOPY (Pts 45-65yr Insurance coverage will need to be confirmed)  05/09/2028   Pneumonia Vaccine 67 Years old  Completed   Hepatitis C Screening  Completed   HPV VACCINES  Aged Out    Health Maintenance  Health Maintenance Due  Topic Date Due   Zoster Vaccines- Shingrix (2 of 2) 05/16/2020   COVID-19 Vaccine (4 - Moderna risk series) 12/27/2020   INFLUENZA VACCINE  07/28/2022    Colorectal cancer screening: Type of screening: Colonoscopy. Completed 05/09/2018. Repeat every 10 years  Lung Cancer Screening: (Low Dose CT Chest recommended if Age 505-80years, 30 pack-year currently smoking OR have quit w/in 15years.) does not qualify.   Lung Cancer Screening Referral: n/a  Additional Screening:  Hepatitis C Screening: does not qualify; Completed 05/19/2017  Vision Screening: Recommended annual ophthalmology exams for early  detection of glaucoma and other disorders of the eye. Is the patient up to date with their annual eye exam?  Yes  Who is the provider or what is the name of the office in which the patient attends annual eye exams? Dr.Gould  If pt is not established  with a provider, would they like to be referred to a provider to establish care? No .   Dental Screening: Recommended annual dental exams for proper oral hygiene  Community Resource Referral / Chronic Care Management: CRR required this visit?  No   CCM required this visit?  No      Plan:     I have personally reviewed and noted the following in the patient's chart:   Medical and social history Use of alcohol, tobacco or illicit drugs  Current medications and supplements including opioid prescriptions. Patient is not currently taking opioid prescriptions. Functional ability and status Nutritional status Physical activity Advanced directives List of other physicians Hospitalizations, surgeries, and ER visits in previous 12 months Vitals Screenings to include cognitive, depression, and falls Referrals and appointments  In addition, I have reviewed and discussed with patient certain preventive protocols, quality metrics, and best practice recommendations. A written personalized care plan for preventive services as well as general preventive health recommendations were provided to patient.     Louis Shepherd, LPN   [id number]   Nurse Notes: Aim for 30 minutes of exercise or brisk walking, 6-8 glasses of water, and 5 servings of fruits and vegetables each day.

## 2022-08-26 NOTE — Patient Instructions (Signed)
Louis Fox , Thank you for taking time to come for your Medicare Wellness Visit. I appreciate your ongoing commitment to your health goals. Please review the following plan we discussed and let me know if I can assist you in the future.   Screening recommendations/referrals: Colonoscopy: 05/09/2018 due 2029 Recommended yearly ophthalmology/optometry visit for glaucoma screening and checkup Recommended yearly dental visit for hygiene and checkup  Vaccinations: Influenza vaccine: completed  Pneumococcal vaccine: completed  Tdap vaccine: 12/17/2014 Shingles vaccine: completed    Covid-19: completed   Advanced directives: yes   Conditions/risks identified: Aim for 30 minutes of exercise or brisk walking, 6-8 glasses of water, and 5 servings of fruits and vegetables each day.   Next appointment: Follow up in one year for your annual wellness visit.   Preventive Care 67 Years and Older, Male  Preventive care refers to lifestyle choices and visits with your health care provider that can promote health and wellness. What does preventive care include? A yearly physical exam. This is also called an annual well check. Dental exams once or twice a year. Routine eye exams. Ask your health care provider how often you should have your eyes checked. Personal lifestyle choices, including: Daily care of your teeth and gums. Regular physical activity. Eating a healthy diet. Avoiding tobacco and drug use. Limiting alcohol use. Practicing safe sex. Taking low doses of aspirin every day. Taking vitamin and mineral supplements as recommended by your health care provider. What happens during an annual well check? The services and screenings done by your health care provider during your annual well check will depend on your age, overall health, lifestyle risk factors, and family history of disease. Counseling  Your health care provider may ask you questions about your: Alcohol use. Tobacco  use. Drug use. Emotional well-being. Home and relationship well-being. Sexual activity. Eating habits. History of falls. Memory and ability to understand (cognition). Work and work Statistician. Screening  You may have the following tests or measurements: Height, weight, and BMI. Blood pressure. Lipid and cholesterol levels. These may be checked every 5 years, or more frequently if you are over 15 years old. Skin check. Lung cancer screening. You may have this screening every year starting at age 67 if you have a 30-pack-year history of smoking and currently smoke or have quit within the past 15 years. Fecal occult blood test (FOBT) of the stool. You may have this test every year starting at age 67. Flexible sigmoidoscopy or colonoscopy. You may have a sigmoidoscopy every 5 years or a colonoscopy every 10 years starting at age 67. Prostate cancer screening. Recommendations will vary depending on your family history and other risks. Hepatitis C blood test. Hepatitis B blood test. Sexually transmitted disease (STD) testing. Diabetes screening. This is done by checking your blood sugar (glucose) after you have not eaten for a while (fasting). You may have this done every 1-3 years. Abdominal aortic aneurysm (AAA) screening. You may need this if you are a current or former smoker. Osteoporosis. You may be screened starting at age 67 if you are at high risk. Talk with your health care provider about your test results, treatment options, and if necessary, the need for more tests. Vaccines  Your health care provider may recommend certain vaccines, such as: Influenza vaccine. This is recommended every year. Tetanus, diphtheria, and acellular pertussis (Tdap, Td) vaccine. You may need a Td booster every 10 years. Zoster vaccine. You may need this after age 67. Pneumococcal 13-valent conjugate (PCV13) vaccine.  One dose is recommended after age 67. Pneumococcal polysaccharide (PPSV23) vaccine.  One dose is recommended after age 67. Talk to your health care provider about which screenings and vaccines you need and how often you need them. This information is not intended to replace advice given to you by your health care provider. Make sure you discuss any questions you have with your health care provider. Document Released: 01/10/2016 Document Revised: 09/02/2016 Document Reviewed: 10/15/2015 Elsevier Interactive Patient Education  2017 [place] Prevention in the Home Falls can cause injuries. They can happen to people of all ages. There are many things you can do to make your home safe and to help prevent falls. What can I do on the outside of my home? Regularly fix the edges of walkways and driveways and fix any cracks. Remove anything that might make you trip as you walk through a door, such as a raised step or threshold. Trim any bushes or trees on the path to your home. Use bright outdoor lighting. Clear any walking paths of anything that might make someone trip, such as rocks or tools. Regularly check to see if handrails are loose or broken. Make sure that both sides of any steps have handrails. Any raised decks and porches should have guardrails on the edges. Have any leaves, snow, or ice cleared regularly. Use sand or salt on walking paths during winter. Clean up any spills in your garage right away. This includes oil or grease spills. What can I do in the bathroom? Use night lights. Install grab bars by the toilet and in the tub and shower. Do not use towel bars as grab bars. Use non-skid mats or decals in the tub or shower. If you need to sit down in the shower, use a plastic, non-slip stool. Keep the floor dry. Clean up any water that spills on the floor as soon as it happens. Remove soap buildup in the tub or shower regularly. Attach bath mats securely with double-sided non-slip rug tape. Do not have throw rugs and other things on the floor that can make  you trip. What can I do in the bedroom? Use night lights. Make sure that you have a light by your bed that is easy to reach. Do not use any sheets or blankets that are too big for your bed. They should not hang down onto the floor. Have a firm chair that has side arms. You can use this for support while you get dressed. Do not have throw rugs and other things on the floor that can make you trip. What can I do in the kitchen? Clean up any spills right away. Avoid walking on wet floors. Keep items that you use a lot in easy-to-reach places. If you need to reach something above you, use a strong step stool that has a grab bar. Keep electrical cords out of the way. Do not use floor polish or wax that makes floors slippery. If you must use wax, use non-skid floor wax. Do not have throw rugs and other things on the floor that can make you trip. What can I do with my stairs? Do not leave any items on the stairs. Make sure that there are handrails on both sides of the stairs and use them. Fix handrails that are broken or loose. Make sure that handrails are as long as the stairways. Check any carpeting to make sure that it is firmly attached to the stairs. Fix any carpet that is loose or  worn. Avoid having throw rugs at the top or bottom of the stairs. If you do have throw rugs, attach them to the floor with carpet tape. Make sure that you have a light switch at the top of the stairs and the bottom of the stairs. If you do not have them, ask someone to add them for you. What else can I do to help prevent falls? Wear shoes that: Do not have high heels. Have rubber bottoms. Are comfortable and fit you well. Are closed at the toe. Do not wear sandals. If you use a stepladder: Make sure that it is fully opened. Do not climb a closed stepladder. Make sure that both sides of the stepladder are locked into place. Ask someone to hold it for you, if possible. Clearly mark and make sure that you can  see: Any grab bars or handrails. First and last steps. Where the edge of each step is. Use tools that help you move around (mobility aids) if they are needed. These include: Canes. Walkers. Scooters. Crutches. Turn on the lights when you go into a dark area. Replace any light bulbs as soon as they burn out. Set up your furniture so you have a clear path. Avoid moving your furniture around. If any of your floors are uneven, fix them. If there are any pets around you, be aware of where they are. Review your medicines with your doctor. Some medicines can make you feel dizzy. This can increase your chance of falling. Ask your doctor what other things that you can do to help prevent falls. This information is not intended to replace advice given to you by your health care provider. Make sure you discuss any questions you have with your health care provider. Document Released: 10/10/2009 Document Revised: 05/21/2016 Document Reviewed: 01/18/2015 Elsevier Interactive Patient Education  2017 [employer].

## 2022-09-15 ENCOUNTER — Ambulatory Visit (INDEPENDENT_AMBULATORY_CARE_PROVIDER_SITE_OTHER): Payer: Medicare Other

## 2022-09-15 ENCOUNTER — Encounter: Payer: Self-pay | Admitting: Adult Health

## 2022-09-15 ENCOUNTER — Ambulatory Visit (INDEPENDENT_AMBULATORY_CARE_PROVIDER_SITE_OTHER): Payer: Medicare Other | Admitting: Adult Health

## 2022-09-15 VITALS — BP 110/60 | HR 95 | Temp 98.5°F | Ht 67.0 in | Wt 164.0 lb

## 2022-09-15 DIAGNOSIS — R1011 Right upper quadrant pain: Secondary | ICD-10-CM | POA: Diagnosis not present

## 2022-09-15 MED ORDER — TRAMADOL HCL 50 MG PO TABS: 50.0000 mg | ORAL_TABLET | Freq: Three times a day (TID) | ORAL | 0 refills | Status: AC | PRN
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
## 2022-09-16 ENCOUNTER — Other Ambulatory Visit: Payer: Self-pay | Admitting: Adult Health

## 2022-09-16 ENCOUNTER — Encounter: Payer: Self-pay | Admitting: Adult Health

## 2022-09-16 ENCOUNTER — Telehealth: Payer: Self-pay | Admitting: Adult Health

## 2022-09-16 LAB — CBC WITH DIFFERENTIAL/PLATELET
Basophils Absolute: 0.1 10*3/uL (ref 0.0–0.1)
Basophils Relative: 0.7 % (ref 0.0–3.0)
Eosinophils Absolute: 0.1 10*3/uL (ref 0.0–0.7)
Eosinophils Relative: 0.9 % (ref 0.0–5.0)
HCT: 42 % (ref 39.0–52.0)
Hemoglobin: 14.4 g/dL (ref 13.0–17.0)
Lymphocytes Relative: 7 % — ABNORMAL LOW (ref 12.0–46.0)
Lymphs Abs: 0.9 10*3/uL (ref 0.7–4.0)
MCHC: 34.2 g/dL (ref 30.0–36.0)
MCV: 88.9 fl (ref 78.0–100.0)
Monocytes Absolute: 0.8 10*3/uL (ref 0.1–1.0)
Monocytes Relative: 6 % (ref 3.0–12.0)
Neutro Abs: 11.5 10*3/uL — ABNORMAL HIGH (ref 1.4–7.7)
Neutrophils Relative %: 85.4 % — ABNORMAL HIGH (ref 43.0–77.0)
Platelets: 241 10*3/uL (ref 150.0–400.0)
RBC: 4.72 Mil/uL (ref 4.22–5.81)
RDW: 12.9 % (ref 11.5–15.5)
WBC: 13.4 10*3/uL — ABNORMAL HIGH (ref 4.0–10.5)

## 2022-09-16 LAB — URINALYSIS
Bilirubin Urine: NEGATIVE
Hgb urine dipstick: NEGATIVE
Ketones, ur: NEGATIVE
Leukocytes,Ua: NEGATIVE
Nitrite: NEGATIVE
Specific Gravity, Urine: 1.01 (ref 1.000–1.030)
Total Protein, Urine: NEGATIVE
Urine Glucose: NEGATIVE
Urobilinogen, UA: 1 (ref 0.0–1.0)
pH: 7 (ref 5.0–8.0)

## 2022-09-16 LAB — COMPREHENSIVE METABOLIC PANEL
ALT: 8 U/L (ref 0–53)
AST: 13 U/L (ref 0–37)
Albumin: 4 g/dL (ref 3.5–5.2)
Alkaline Phosphatase: 55 U/L (ref 39–117)
BUN: 15 mg/dL (ref 6–23)
CO2: 26 mEq/L (ref 19–32)
Calcium: 9.5 mg/dL (ref 8.4–10.5)
Chloride: 98 mEq/L (ref 96–112)
Creatinine, Ser: 0.84 mg/dL (ref 0.40–1.50)
GFR: 90.12 mL/min (ref >60.00)
Glucose, Bld: 107 mg/dL — ABNORMAL HIGH (ref 70–99)
Potassium: 4 mEq/L (ref 3.5–5.1)
Sodium: 133 mEq/L — ABNORMAL LOW (ref 135–145)
Total Bilirubin: 0.8 mg/dL (ref 0.2–1.2)
Total Protein: 6.8 g/dL (ref 6.0–8.3)

## 2022-09-16 LAB — AMYLASE: Amylase: 380 U/L — ABNORMAL HIGH (ref 27–131)

## 2022-09-16 MED ORDER — AMOXICILLIN-POT CLAVULANATE 875-125 MG PO TABS: 1.0000 | ORAL_TABLET | Freq: Two times a day (BID) | ORAL | 0 refills | Status: DC

## 2022-09-16 NOTE — Telephone Encounter (Signed)
Updated patient on his labs   Amaylase elevated at [place] [facility]  He has had a fever up to 102 at home but no longer febrile and is feeling " much better"   Will send in Augmentin while we wait for [place] RUQ

## 2022-09-16 NOTE — Telephone Encounter (Signed)
FYI

## 2022-09-17 ENCOUNTER — Ambulatory Visit
Admission: RE | Admit: 2022-09-17 | Discharge: 2022-09-17 | Disposition: A | Discharge status: Home / Self Care | Payer: Medicare Other | Source: Ambulatory Visit | Attending: Adult Health | Admitting: Adult Health

## 2022-09-17 DIAGNOSIS — R1011 Right upper quadrant pain: Secondary | ICD-10-CM | POA: Diagnosis not present

## 2022-09-18 ENCOUNTER — Telehealth: Payer: Self-pay | Admitting: Family Medicine

## 2022-09-18 ENCOUNTER — Other Ambulatory Visit: Payer: Self-pay

## 2022-09-18 DIAGNOSIS — K831 Obstruction of bile duct: Secondary | ICD-10-CM

## 2022-09-18 NOTE — Telephone Encounter (Signed)
Pt called, returning CMA's call. CMA was unavailable. Pt asked that CMA call back at her earliest convenience. 

## 2022-09-22 NOTE — Telephone Encounter (Signed)
Unsure what the pt needs as there is no phone encounter/result note.  LVM requesting a call back for clarification.

## 2022-09-24 NOTE — Telephone Encounter (Signed)
Tried to call pt to see the purpose of the call as I did not call him. Pt waiting on labs from previous appt. I think. Can you result labs and I will call pt back.

## 2022-09-25 NOTE — Telephone Encounter (Signed)
Noted  

## 2022-10-04 IMAGING — DX DG CHEST 2V
2 series · 2 of 2 positions shown · non-contrast
Comparison: Radiographs 03/12/2020 and 03/06/2019.

CLINICAL DATA: Renal cancer.

EXAM:
CHEST - 2 VIEW

[chest pa]
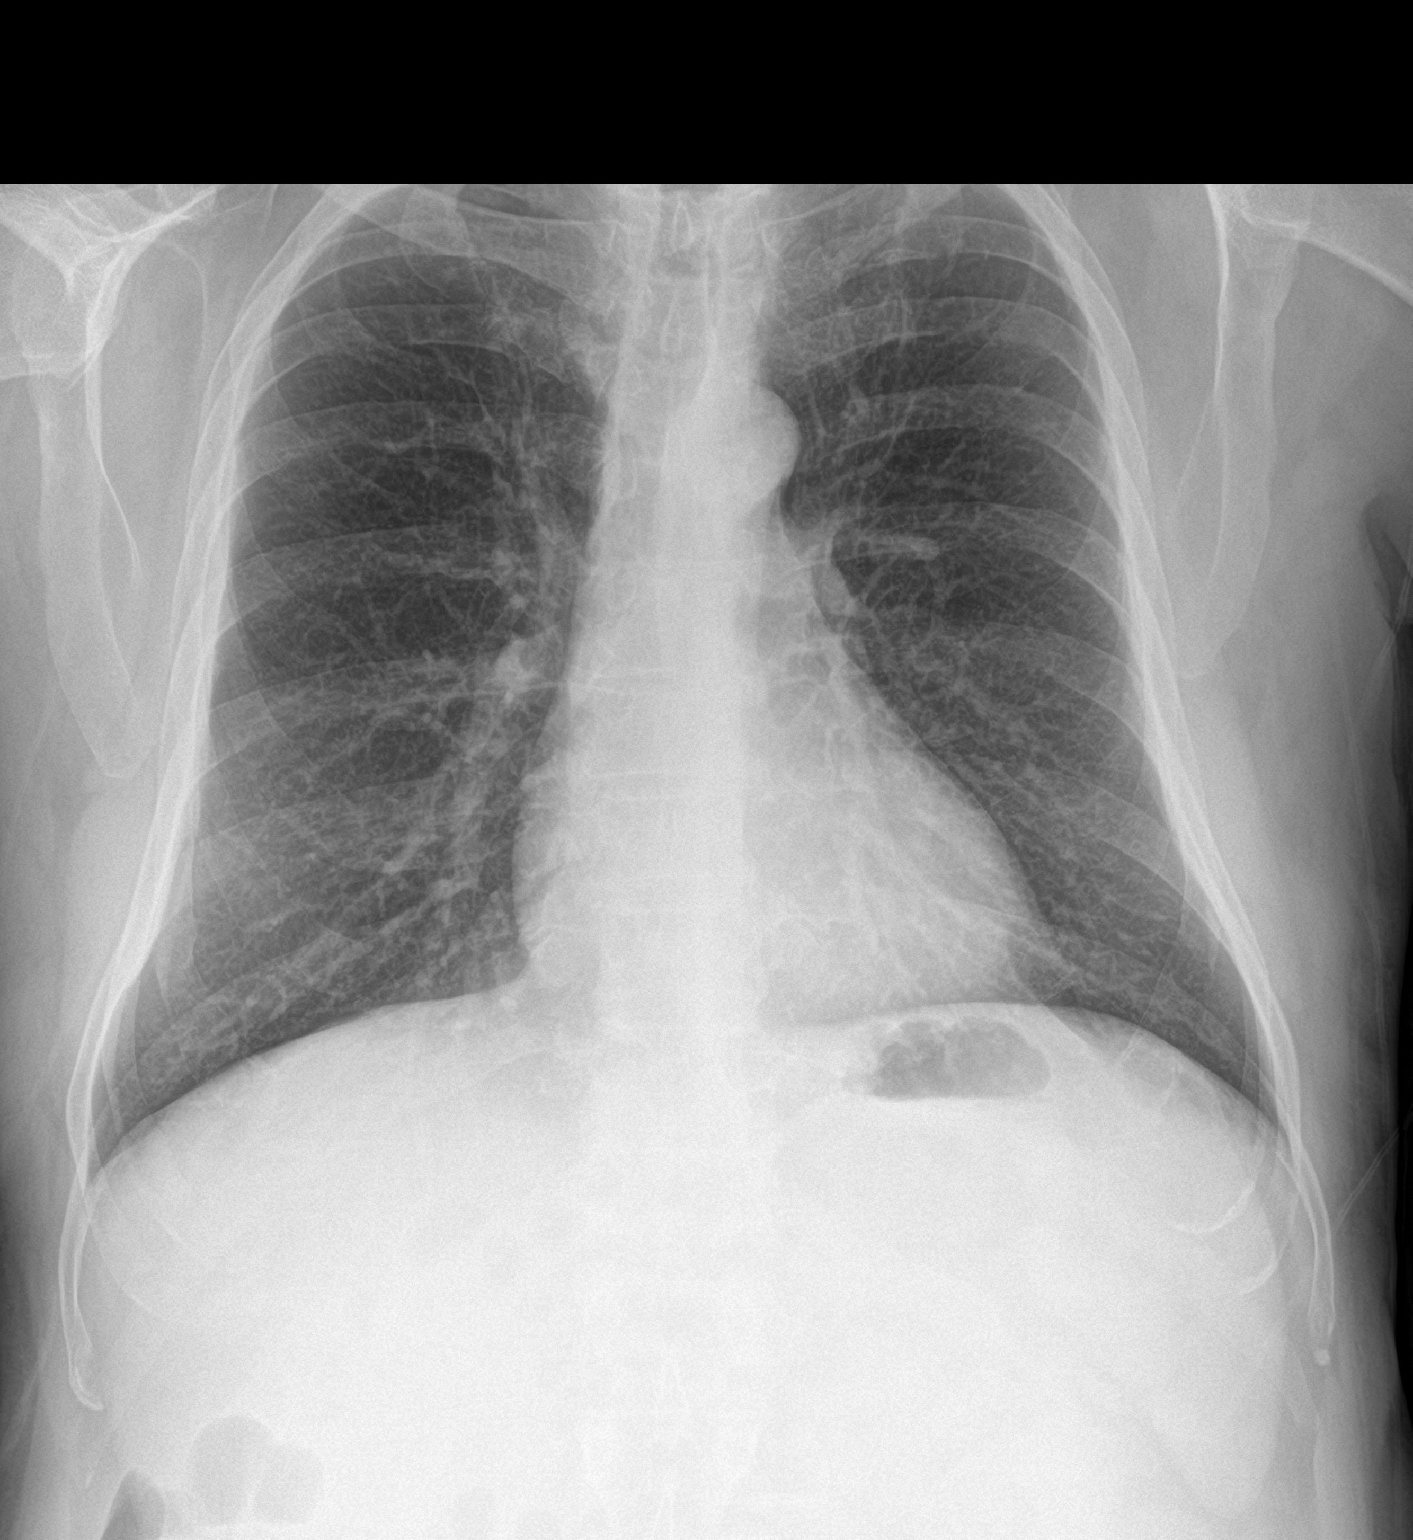

[chest lat]
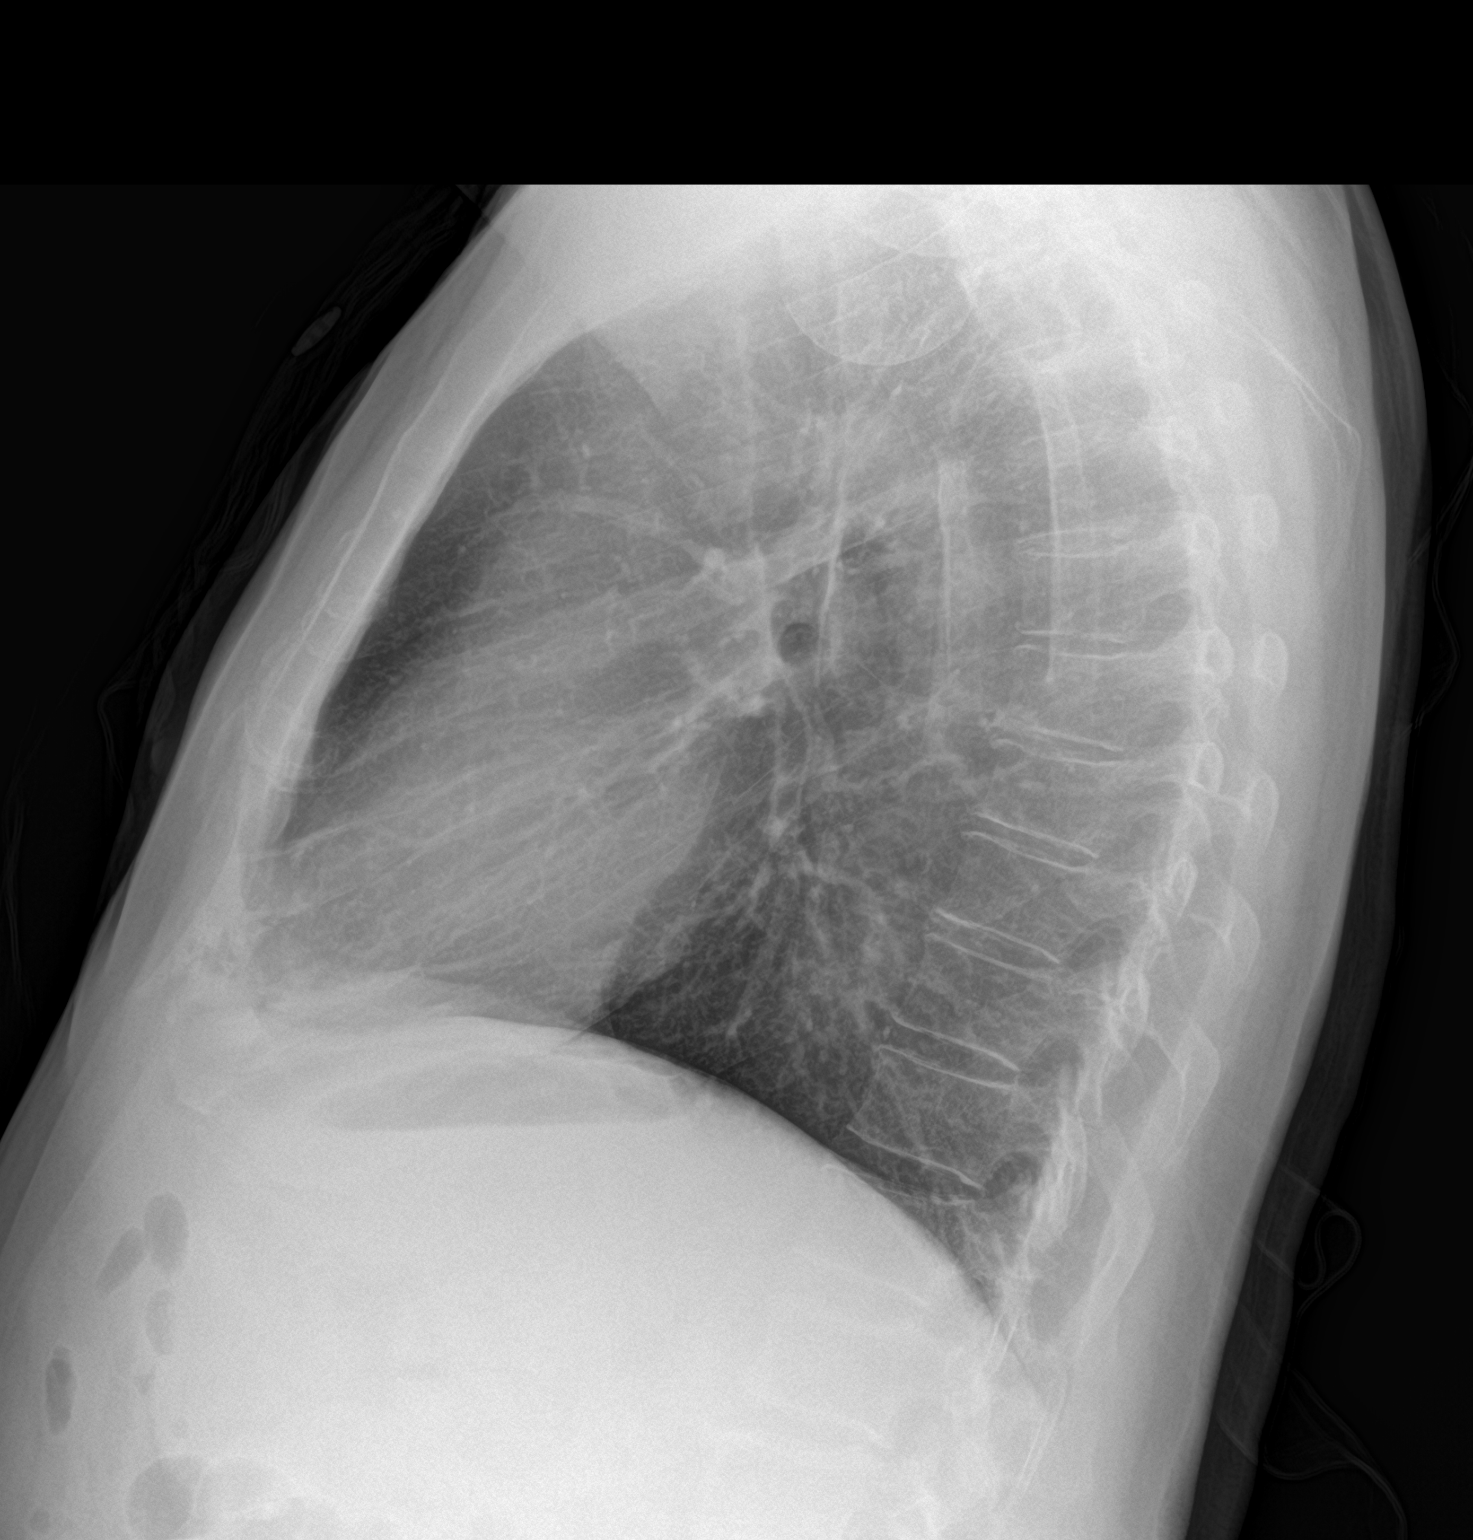

[2 of 2 positions shown; findings below may reference images not displayed]

FINDINGS: The heart size and mediastinal contours are stable. There is stable
mild interstitial prominence throughout the lungs, but no
nodularity, edema, confluent airspace opacity or pleural effusion.
There is no pneumothorax. The bones appear unremarkable.
IMPRESSION: Stable chest.  No acute findings or evidence of metastatic disease.

## 2022-10-15 ENCOUNTER — Ambulatory Visit
Admission: RE | Admit: 2022-10-15 | Discharge: 2022-10-15 | Disposition: A | Discharge status: Home / Self Care | Payer: Medicare Other | Source: Ambulatory Visit | Attending: Adult Health | Admitting: Adult Health

## 2022-10-15 DIAGNOSIS — Z9889 Other specified postprocedural states: Secondary | ICD-10-CM | POA: Diagnosis not present

## 2022-10-15 DIAGNOSIS — N400 Enlarged prostate without lower urinary tract symptoms: Secondary | ICD-10-CM | POA: Diagnosis not present

## 2022-10-15 DIAGNOSIS — I700 Atherosclerosis of aorta: Secondary | ICD-10-CM | POA: Diagnosis not present

## 2022-10-15 DIAGNOSIS — K831 Obstruction of bile duct: Secondary | ICD-10-CM

## 2022-10-15 DIAGNOSIS — K829 Disease of gallbladder, unspecified: Secondary | ICD-10-CM | POA: Diagnosis not present

## 2022-10-15 MED ORDER — IOPAMIDOL (ISOVUE-300) INJECTION 61%
100.0000 mL | Freq: Once | INTRAVENOUS | Status: AC | PRN
  Administered 2022-10-15: 100 mL via INTRAVENOUS

## 2022-10-20 ENCOUNTER — Telehealth: Payer: Self-pay | Admitting: Family Medicine

## 2022-10-20 NOTE — Telephone Encounter (Signed)
Patient returned call regarding CT scan results

## 2022-10-31 DIAGNOSIS — Z23 Encounter for immunization: Secondary | ICD-10-CM | POA: Diagnosis not present

## 2022-11-13 ENCOUNTER — Encounter: Payer: Self-pay | Admitting: Family Medicine

## 2022-11-23 ENCOUNTER — Encounter: Payer: Self-pay | Admitting: Family Medicine

## 2022-11-23 ENCOUNTER — Ambulatory Visit (INDEPENDENT_AMBULATORY_CARE_PROVIDER_SITE_OTHER): Payer: Medicare Other | Admitting: Family Medicine

## 2022-11-23 VITALS — BP 126/80 | HR 69 | Temp 98.9°F | Resp 17 | Ht 67.0 in | Wt 166.1 lb

## 2022-11-23 DIAGNOSIS — M25561 Pain in right knee: Secondary | ICD-10-CM

## 2022-11-23 DIAGNOSIS — R1011 Right upper quadrant pain: Secondary | ICD-10-CM

## 2022-11-23 DIAGNOSIS — G8929 Other chronic pain: Secondary | ICD-10-CM

## 2022-11-23 DIAGNOSIS — K4090 Unilateral inguinal hernia, without obstruction or gangrene, not specified as recurrent: Secondary | ICD-10-CM

## 2022-11-23 NOTE — Progress Notes (Unsigned)
   Subjective:    Patient ID: Louis Fox, male    DOB: Jul 29, 1956, 66 y.o.   MRN: [id number]  HPI Gallbladder issue- pt had RUQ pain 9/19.  Had [place] 9/21 that was unremarkable.  CT on 10/19 was WNL w/ exception of enlarged prostate.   Pt reports pain stopped after stopping Fenofibrate.  No N/V.  Pt reports at the time of his pain, he had much more fat intake than usual due to travel.  Diet has since normalized.    R knee pain- pt reports this was previously dx'd as 'unrepairable'.  He was told to come back when it's 'time to get it replaced'.  Pt reports he is having pain and popping w/ knee extension.  Pt likes to ride his bike and walk regularly but this has caused him to alter his gait.  R inguinal hernia- intermittently painful.  Sxs started ~6 weeks ago.  Initially thought it was a pulled muscle   Review of Systems For ROS see HPI     Objective:   Physical Exam Vitals reviewed.  Constitutional:      General: He is not in acute distress.    Appearance: He is well-developed. He is not ill-appearing.  HENT:     Head: Normocephalic and atraumatic.  Abdominal:     General: There is no distension.     Palpations: Abdomen is soft. There is no hepatomegaly or mass.     Tenderness: There is no abdominal tenderness.  Musculoskeletal:     Comments: Crepitus w/ R knee extension  Skin:    General: Skin is warm and dry.  Neurological:     General: No focal deficit present.     Mental Status: He is alert and oriented to person, place, and time.  Psychiatric:        Mood and Affect: Mood normal.        Behavior: Behavior normal.           Assessment & Plan:  RUQ pain- new to provider.  Reviewed notes from September and also labs and imaging.  Pt reports pain resolved after stopping Fenofibrate and limiting his fat intake.  Will repeat labs that were abnormal- CBC, Amylase, CMP- but no further workup needed at this time based on sxs and imaging.  Pt expressed understanding and is  in agreement w/ plan.   R knee pain- new to provider, chronic for pt.  He was told years ago that this was not something that could be fixed but he wonders if things have changed since then.  His knee is now causing him pain when he rides his bike or tries to walk/hike.  Referral to ortho placed.  R inguinal hernia- new.  Pt would like to discuss options w/ a Psychologist, sport and exercise.  Referral placed.

## 2022-11-23 NOTE — Patient Instructions (Signed)
Follow up as needed or as scheduled We'll notify you of your lab results and make any changes if needed We'll call you with your Orthopedic referral and Surgery referral to talk about the knee and the hernia I'm so glad the abdominal pain is better!  Keep up the good work on the low fat diet Call with any questions or concerns Happy Holidays!!!

## 2022-11-24 LAB — BASIC METABOLIC PANEL
BUN: 18 mg/dL (ref 6–23)
CO2: 29 mEq/L (ref 19–32)
Calcium: 9.5 mg/dL (ref 8.4–10.5)
Chloride: 101 mEq/L (ref 96–112)
Creatinine, Ser: 0.91 mg/dL (ref 0.40–1.50)
GFR: 86.98 mL/min (ref >60.00)
Glucose, Bld: 75 mg/dL (ref 70–99)
Potassium: 4.8 mEq/L (ref 3.5–5.1)
Sodium: 137 mEq/L (ref 135–145)

## 2022-11-24 LAB — CBC WITH DIFFERENTIAL/PLATELET
Basophils Absolute: 0 10*3/uL (ref 0.0–0.1)
Basophils Relative: 0.6 % (ref 0.0–3.0)
Eosinophils Absolute: 0.2 10*3/uL (ref 0.0–0.7)
Eosinophils Relative: 3 % (ref 0.0–5.0)
HCT: 41.5 % (ref 39.0–52.0)
Hemoglobin: 14.3 g/dL (ref 13.0–17.0)
Lymphocytes Relative: 15.1 % (ref 12.0–46.0)
Lymphs Abs: 1.2 10*3/uL (ref 0.7–4.0)
MCHC: 34.5 g/dL (ref 30.0–36.0)
MCV: 89.4 fl (ref 78.0–100.0)
Monocytes Absolute: 0.6 10*3/uL (ref 0.1–1.0)
Monocytes Relative: 7.2 % (ref 3.0–12.0)
Neutro Abs: 6 10*3/uL (ref 1.4–7.7)
Neutrophils Relative %: 74.1 % (ref 43.0–77.0)
Platelets: 257 10*3/uL (ref 150.0–400.0)
RBC: 4.64 Mil/uL (ref 4.22–5.81)
RDW: 12.7 % (ref 11.5–15.5)
WBC: 8.2 10*3/uL (ref 4.0–10.5)

## 2022-11-24 LAB — HEPATIC FUNCTION PANEL
ALT: 9 U/L (ref 0–53)
AST: 16 U/L (ref 0–37)
Albumin: 4.3 g/dL (ref 3.5–5.2)
Alkaline Phosphatase: 57 U/L (ref 39–117)
Bilirubin, Direct: 0.1 mg/dL (ref 0.0–0.3)
Total Bilirubin: 0.6 mg/dL (ref 0.2–1.2)
Total Protein: 6.4 g/dL (ref 6.0–8.3)

## 2022-11-24 LAB — AMYLASE: Amylase: 51 U/L (ref 27–131)

## 2022-11-25 ENCOUNTER — Telehealth: Payer: Self-pay

## 2022-11-25 NOTE — Telephone Encounter (Signed)
Left pt a vm stating lab results

## 2022-11-25 NOTE — Telephone Encounter (Signed)
-----   Message from Midge Minium, MD sent at 11/25/2022  7:25 AM EST ----- Labs are all normal.  This is great news!

## 2022-12-08 ENCOUNTER — Ambulatory Visit: Payer: Self-pay | Admitting: Surgery

## 2022-12-08 DIAGNOSIS — K4090 Unilateral inguinal hernia, without obstruction or gangrene, not specified as recurrent: Secondary | ICD-10-CM | POA: Diagnosis not present
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
## 2022-12-09 ENCOUNTER — Ambulatory Visit (INDEPENDENT_AMBULATORY_CARE_PROVIDER_SITE_OTHER): Payer: Medicare Other | Admitting: Orthopaedic Surgery

## 2022-12-09 ENCOUNTER — Ambulatory Visit (INDEPENDENT_AMBULATORY_CARE_PROVIDER_SITE_OTHER): Payer: Medicare Other

## 2022-12-09 DIAGNOSIS — M25561 Pain in right knee: Secondary | ICD-10-CM

## 2022-12-09 DIAGNOSIS — G8929 Other chronic pain: Secondary | ICD-10-CM

## 2022-12-09 MED ORDER — TRIAMCINOLONE ACETONIDE 40 MG/ML IJ SUSP
80.0000 mg | INTRAMUSCULAR | Status: AC | PRN
  Administered 2022-12-09: 80 mg via INTRA_ARTICULAR

## 2022-12-09 MED ORDER — LIDOCAINE HCL 1 % IJ SOLN
4.0000 mL | INTRAMUSCULAR | Status: AC | PRN
  Administered 2022-12-09: 4 mL

## 2022-12-09 NOTE — Progress Notes (Signed)
Chief Complaint: Right knee pain     History of Present Illness:    Louis Fox is a 66 y.o. male presents today with ongoing right knee pain which has been chronic in nature for the last 10 years.  He was previously told on years prior that he did have a medial meniscal tear for which no intervention was needed.  He is here today for further assessment.  He does overall walk with a limp favoring his right side.  His tenderness is predominantly medially.  He is complaining of swelling and Baker's cyst in the posterior aspect of the knee.  He is occasionally having a popping with extension.  He does enjoy staying active and walking daily.    Surgical History:   none  PMH/PSH/Family History/Social History/Meds/Allergies:    Past Medical History:  Diagnosis Date  . Arthritis   . Family history of punctured lung    patient with hx of puncture lung as a teenager - left lung   . GERD (gastroesophageal reflux disease)    hx of  . History of kidney cancer   . Pancreatitis   . Pseudogout    Past Surgical History:  Procedure Laterality Date  . ESOPHAGOGASTRODUODENOSCOPY N/A 09/28/2015   Procedure: ESOPHAGOGASTRODUODENOSCOPY (EGD);  Surgeon: Inda Castle, MD;  Location: [facility];  Service: Endoscopy;  Laterality: N/A;  . ROBOT ASSISTED LAPAROSCOPIC NEPHRECTOMY Right 11/25/2015   Procedure: ROBOTIC ASSISTED LAPAROSCOPIC  PARTIAL NEPHRECTOMY;  Surgeon: Raynelle Bring, MD;  Location: [facility];  Service: Urology;  Laterality: Right;  . umblical hernia     Social History   Socioeconomic History  . Marital status: Married    Spouse name: Not on file  . Number of children: Not on file  . Years of education: Not on file  . Highest education level: Not on file  Occupational History  . Not on file  Tobacco Use  . Smoking status: Never  . Smokeless tobacco: Never  Substance and Sexual Activity  . Alcohol use: No  . Drug use: No  . Sexual  activity: Not on file  Other Topics Concern  . Not on file  Social History Narrative  . Not on file   Social Determinants of Health   Financial Resource Strain: Not on file  Food Insecurity: Not on file  Transportation Needs: Not on file  Physical Activity: Sufficiently Active (08/26/2022)   Exercise Vital Sign   . Days of Exercise per Week: 3 days   . Minutes of Exercise per Session: 60 min  Stress: Not on file  Social Connections: Not on file   Family History  Problem Relation Age of Onset  . Stroke Mother   . Hypertension Brother   . Cancer Brother        kidney   Allergies  Allergen Reactions  . Shellfish Allergy Swelling  . Fenofibrate Other (See Comments)    Diffuse joint pains   Current Outpatient Medications  Medication Sig Dispense Refill  . acetaminophen (TYLENOL) 500 MG tablet Take 1,000 mg by mouth daily as needed for mild pain.    Marland Kitchen ibuprofen (ADVIL) 600 MG tablet Take 600 mg by mouth every 6 (six) hours as needed.    . TURMERIC PO Take by mouth.     No current facility-administered medications for this visit.  No results found.  Review of Systems:   A ROS was performed including pertinent positives and negatives as documented in the HPI.  Physical Exam :   Constitutional: NAD and appears stated age Neurological: Alert and oriented Psych: Appropriate affect and cooperative There were no vitals taken for this visit.   Comprehensive Musculoskeletal Exam:      Musculoskeletal Exam  Gait Normal  Alignment Normal   Right Left  Inspection Normal Normal  Palpation    Tenderness Right medial joint none  Crepitus None None  Effusion Positive none  Range of Motion    Extension 0 0  Flexion 135 135  Strength    Extension 5/5 5/5  Flexion 5/5 5/5  Ligament Exam     Generalized Laxity No No  Lachman Negative Negative   Pivot Shift Negative Negative  Anterior Drawer Negative Negative  Valgus at 0 Negative Negative  Valgus at 20 Negative  Negative  Varus at 0 0 0  Varus at 20   0 0  Posterior Drawer at 90 0 0  Vascular/Lymphatic Exam    Edema None None  Venous Stasis Changes No No  Distal Circulation Normal Normal  Neurologic    Light Touch Sensation Intact Intact  Special Tests:      Imaging:   Xray (right knee 4 views): Medial joint space narrowing consistent with moderate osteoarthritis with lateral chondrocalcinosis consistent with pseudogout   I personally reviewed and interpreted the radiographs.   Assessment:   66 y.o. 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  I did describe that this is likely accounting for swelling in the majority of his mechanical symptoms.  That being said he has not had any injections and I do believe that he would get quite significant relief from an injection.  He would like to proceed with this.  I do believe an external referral for therapy for strengthening of the hip and quad is also ideal to help him optimize the strength of his right knee.  I will plan to see him back on an as-needed basis  Plan :    -   Procedure Note  Patient: Louis Fox             Date of Birth: [birth year]           MRN: [id number]             Visit Date: 12/09/2022  Procedures: Visit Diagnoses:  1. Chronic pain of right knee     Large Joint Inj: R knee on 12/09/2022 11:10 AM Indications: pain Details: 22 G 1.5 in needle, ultrasound-guided anterior approach  Arthrogram: No  Medications: 4 mL lidocaine 1 %; 80 mg triamcinolone acetonide 40 MG/ML Outcome: tolerated well, no immediate complications Procedure, treatment alternatives, risks and benefits explained, specific risks discussed. Consent was given by the patient. Immediately prior to procedure a time out was called to verify the correct patient, procedure, equipment, support staff and site/side marked as required. Patient was prepped and draped in the usual  sterile fashion.          I personally saw and evaluated the patient, and participated in the management and treatment plan.  Vanetta Mulders, MD Attending Physician, Orthopedic Surgery  This document was dictated using Dragon voice recognition software. A reasonable attempt at proof reading has been made to minimize errors.

## 2022-12-31 ENCOUNTER — Encounter (HOSPITAL_BASED_OUTPATIENT_CLINIC_OR_DEPARTMENT_OTHER): Payer: Self-pay | Admitting: Surgery

## 2022-12-31 ENCOUNTER — Other Ambulatory Visit: Payer: Self-pay

## 2023-01-01 NOTE — Progress Notes (Signed)
Patient provided with CHG soap along with education on application. Patient understanding of information         Patient Instructions  The night before surgery:  No food after midnight. ONLY clear liquids after midnight  The day of surgery (if you do NOT have diabetes):  Drink ONE (1) Pre-Surgery Clear Ensure as directed.   This drink was given to you during your hospital  pre-op appointment visit. The pre-op nurse will instruct you on the time to drink the  Pre-Surgery Ensure depending on your surgery time. Finish the drink at the designated time by the pre-op nurse.  Nothing else to drink after completing the  Pre-Surgery Clear Ensure.  The day of surgery (if you have diabetes): Drink ONE (1) Gatorade 2 (G2) as directed. This drink was given to you during your hospital  pre-op appointment visit.  The pre-op nurse will instruct you on the time to drink the   Gatorade 2 (G2) depending on your surgery time. Color of the Gatorade may vary. Red is not allowed. Nothing else to drink after completing the  Gatorade 2 (G2).         If you have questions, please contact your surgeon's office.

## 2023-01-06 ENCOUNTER — Other Ambulatory Visit: Payer: Self-pay

## 2023-01-06 ENCOUNTER — Encounter (HOSPITAL_BASED_OUTPATIENT_CLINIC_OR_DEPARTMENT_OTHER): Payer: Self-pay | Admitting: Surgery

## 2023-01-06 ENCOUNTER — Ambulatory Visit (HOSPITAL_BASED_OUTPATIENT_CLINIC_OR_DEPARTMENT_OTHER): Payer: Medicare Other | Admitting: Anesthesiology

## 2023-01-06 ENCOUNTER — Ambulatory Visit (HOSPITAL_BASED_OUTPATIENT_CLINIC_OR_DEPARTMENT_OTHER)
Admission: RE | Admit: 2023-01-06 | Discharge: 2023-01-06 | Disposition: A | Discharge status: Home / Self Care | Payer: Medicare Other | Source: Ambulatory Visit | Attending: Surgery | Admitting: Surgery

## 2023-01-06 ENCOUNTER — Encounter (HOSPITAL_BASED_OUTPATIENT_CLINIC_OR_DEPARTMENT_OTHER)
Admission: RE | Disposition: A | Discharge status: Home / Self Care | Payer: Self-pay | Source: Ambulatory Visit | Attending: Surgery

## 2023-01-06 DIAGNOSIS — Z9889 Other specified postprocedural states: Secondary | ICD-10-CM | POA: Diagnosis not present

## 2023-01-06 DIAGNOSIS — Z905 Acquired absence of kidney: Secondary | ICD-10-CM | POA: Insufficient documentation

## 2023-01-06 DIAGNOSIS — G8918 Other acute postprocedural pain: Secondary | ICD-10-CM | POA: Diagnosis not present

## 2023-01-06 DIAGNOSIS — Z85528 Personal history of other malignant neoplasm of kidney: Secondary | ICD-10-CM | POA: Diagnosis not present

## 2023-01-06 DIAGNOSIS — M1990 Unspecified osteoarthritis, unspecified site: Secondary | ICD-10-CM | POA: Insufficient documentation

## 2023-01-06 DIAGNOSIS — K4090 Unilateral inguinal hernia, without obstruction or gangrene, not specified as recurrent: Secondary | ICD-10-CM | POA: Insufficient documentation

## 2023-01-06 DIAGNOSIS — Z01818 Encounter for other preprocedural examination: Secondary | ICD-10-CM

## 2023-01-06 HISTORY — PX: INGUINAL HERNIA REPAIR: SHX194

## 2023-01-06 SURGERY — REPAIR, HERNIA, INGUINAL, ADULT
Anesthesia: General | Site: Inguinal | Laterality: Right

## 2023-01-06 MED ORDER — MIDAZOLAM HCL 2 MG/2ML IJ SOLN
INTRAMUSCULAR | Status: AC
  Filled 2023-01-06: qty 2

## 2023-01-06 MED ORDER — LACTATED RINGERS IV SOLN: INTRAVENOUS | Status: DC

## 2023-01-06 MED ORDER — FENTANYL CITRATE (PF) 100 MCG/2ML IJ SOLN
INTRAMUSCULAR | Status: DC | PRN
  Administered 2023-01-06 (×2): 50 ug via INTRAVENOUS

## 2023-01-06 MED ORDER — OXYCODONE HCL 5 MG PO TABS: 5.0000 mg | ORAL_TABLET | Freq: Four times a day (QID) | ORAL | 0 refills | Status: DC | PRN

## 2023-01-06 MED ORDER — OXYCODONE HCL 5 MG/5ML PO SOLN: 5.0000 mg | Freq: Once | ORAL | Status: DC | PRN

## 2023-01-06 MED ORDER — DEXAMETHASONE SODIUM PHOSPHATE 10 MG/ML IJ SOLN
INTRAMUSCULAR | Status: AC
  Filled 2023-01-06: qty 1

## 2023-01-06 MED ORDER — FENTANYL CITRATE (PF) 100 MCG/2ML IJ SOLN
INTRAMUSCULAR | Status: AC
  Filled 2023-01-06: qty 2

## 2023-01-06 MED ORDER — BUPIVACAINE-EPINEPHRINE 0.25% -1:200000 IJ SOLN
INTRAMUSCULAR | Status: DC | PRN
  Administered 2023-01-06: 10 mL

## 2023-01-06 MED ORDER — LIDOCAINE 2% (20 MG/ML) 5 ML SYRINGE
INTRAMUSCULAR | Status: DC | PRN
  Administered 2023-01-06: 60 mg via INTRAVENOUS

## 2023-01-06 MED ORDER — CEFAZOLIN SODIUM-DEXTROSE 2-4 GM/100ML-% IV SOLN
INTRAVENOUS | Status: AC
  Filled 2023-01-06: qty 100

## 2023-01-06 MED ORDER — FENTANYL CITRATE (PF) 100 MCG/2ML IJ SOLN
100.0000 ug | Freq: Once | INTRAMUSCULAR | Status: AC
  Administered 2023-01-06: 50 ug via INTRAVENOUS

## 2023-01-06 MED ORDER — EPHEDRINE SULFATE (PRESSORS) 50 MG/ML IJ SOLN
INTRAMUSCULAR | Status: DC | PRN
  Administered 2023-01-06: 5 mg via INTRAVENOUS

## 2023-01-06 MED ORDER — LIDOCAINE 2% (20 MG/ML) 5 ML SYRINGE
INTRAMUSCULAR | Status: AC
  Filled 2023-01-06: qty 5

## 2023-01-06 MED ORDER — CEFAZOLIN SODIUM-DEXTROSE 2-4 GM/100ML-% IV SOLN
2.0000 g | INTRAVENOUS | Status: AC
  Administered 2023-01-06: 2 g via INTRAVENOUS

## 2023-01-06 MED ORDER — MIDAZOLAM HCL 2 MG/2ML IJ SOLN
2.0000 mg | Freq: Once | INTRAMUSCULAR | Status: AC
  Administered 2023-01-06: 1 mg via INTRAVENOUS

## 2023-01-06 MED ORDER — ACETAMINOPHEN 500 MG PO TABS
1000.0000 mg | ORAL_TABLET | ORAL | Status: AC
  Administered 2023-01-06: 1000 mg via ORAL

## 2023-01-06 MED ORDER — FENTANYL CITRATE (PF) 100 MCG/2ML IJ SOLN: 25.0000 ug | INTRAMUSCULAR | Status: DC | PRN

## 2023-01-06 MED ORDER — ONDANSETRON HCL 4 MG/2ML IJ SOLN
INTRAMUSCULAR | Status: AC
  Filled 2023-01-06: qty 2

## 2023-01-06 MED ORDER — ONDANSETRON HCL 4 MG/2ML IJ SOLN: 4.0000 mg | Freq: Four times a day (QID) | INTRAMUSCULAR | Status: DC | PRN

## 2023-01-06 MED ORDER — BUPIVACAINE-EPINEPHRINE (PF) 0.5% -1:200000 IJ SOLN
INTRAMUSCULAR | Status: DC | PRN
  Administered 2023-01-06: 25 mL via PERINEURAL

## 2023-01-06 MED ORDER — ACETAMINOPHEN 500 MG PO TABS
ORAL_TABLET | ORAL | Status: AC
  Filled 2023-01-06: qty 2

## 2023-01-06 MED ORDER — CHLORHEXIDINE GLUCONATE CLOTH 2 % EX PADS: 6.0000 | MEDICATED_PAD | Freq: Once | CUTANEOUS | Status: DC

## 2023-01-06 MED ORDER — ONDANSETRON HCL 4 MG/2ML IJ SOLN
INTRAMUSCULAR | Status: DC | PRN
  Administered 2023-01-06: 4 mg via INTRAVENOUS

## 2023-01-06 MED ORDER — OXYCODONE HCL 5 MG PO TABS: 5.0000 mg | ORAL_TABLET | Freq: Once | ORAL | Status: DC | PRN

## 2023-01-06 MED ORDER — PROPOFOL 10 MG/ML IV BOLUS
INTRAVENOUS | Status: DC | PRN
  Administered 2023-01-06: 50 mg via INTRAVENOUS
  Administered 2023-01-06: 150 mg via INTRAVENOUS

## 2023-01-06 SURGICAL SUPPLY — 52 items
APL PRP STRL LF DISP 70% ISPRP (MISCELLANEOUS) ×1
APL SKNCLS STERI-STRIP NONHPOA (GAUZE/BANDAGES/DRESSINGS) ×1
BENZOIN TINCTURE PRP APPL 2/3 (GAUZE/BANDAGES/DRESSINGS) ×1 IMPLANT
BLADE CLIPPER SURG (BLADE) IMPLANT
BLADE HEX COATED 2.75 (ELECTRODE) ×1 IMPLANT
BLADE SURG 15 STRL LF DISP TIS (BLADE) ×1 IMPLANT
BLADE SURG 15 STRL SS (BLADE) ×1
CHLORAPREP W/TINT 26 (MISCELLANEOUS) ×1 IMPLANT
COVER BACK TABLE 60X90IN (DRAPES) ×1 IMPLANT
COVER MAYO STAND STRL (DRAPES) ×1 IMPLANT
DRAIN PENROSE .5X12 LATEX STL (DRAIN) ×1 IMPLANT
DRAPE LAPAROTOMY TRNSV 102X78 (DRAPES) ×1 IMPLANT
DRAPE UTILITY XL STRL (DRAPES) ×1 IMPLANT
DRSG TEGADERM 4X4.75 (GAUZE/BANDAGES/DRESSINGS) ×1 IMPLANT
ELECT REM PT RETURN 9FT ADLT (ELECTROSURGICAL) ×1
ELECTRODE REM PT RTRN 9FT ADLT (ELECTROSURGICAL) ×1 IMPLANT
GAUZE 4X4 16PLY ~~LOC~~+RFID DBL (SPONGE) ×1 IMPLANT
GAUZE SPONGE 4X4 12PLY STRL (GAUZE/BANDAGES/DRESSINGS) ×1 IMPLANT
GAUZE SPONGE 4X4 12PLY STRL LF (GAUZE/BANDAGES/DRESSINGS) ×1 IMPLANT
GLOVE BIO SURGEON STRL SZ 6.5 (GLOVE) IMPLANT
GLOVE BIO SURGEON STRL SZ7 (GLOVE) ×1 IMPLANT
GLOVE BIOGEL PI IND STRL 6.5 (GLOVE) IMPLANT
GLOVE BIOGEL PI IND STRL 7.5 (GLOVE) ×1 IMPLANT
GOWN STRL REUS W/ TWL LRG LVL3 (GOWN DISPOSABLE) ×2 IMPLANT
GOWN STRL REUS W/ TWL XL LVL3 (GOWN DISPOSABLE) IMPLANT
GOWN STRL REUS W/TWL LRG LVL3 (GOWN DISPOSABLE) ×2
GOWN STRL REUS W/TWL XL LVL3 (GOWN DISPOSABLE) ×1
MESH PARIETEX PROGRIP RIGHT (Mesh General) IMPLANT
NDL HYPO 25X1 1.5 SAFETY (NEEDLE) ×1 IMPLANT
NEEDLE HYPO 25X1 1.5 SAFETY (NEEDLE) ×1 IMPLANT
NS IRRIG 1000ML POUR BTL (IV SOLUTION) IMPLANT
PACK BASIN DAY SURGERY FS (CUSTOM PROCEDURE TRAY) ×1 IMPLANT
PENCIL SMOKE EVACUATOR (MISCELLANEOUS) ×1 IMPLANT
SLEEVE SCD COMPRESS KNEE MED (STOCKING) ×1 IMPLANT
SPIKE FLUID TRANSFER (MISCELLANEOUS) ×1 IMPLANT
SPONGE INTESTINAL PEANUT (DISPOSABLE) ×1 IMPLANT
STRIP CLOSURE SKIN 1/2X4 (GAUZE/BANDAGES/DRESSINGS) ×1 IMPLANT
SUT MNCRL AB 4-0 PS2 18 (SUTURE) ×1 IMPLANT
SUT SILK 2 0 SH (SUTURE) IMPLANT
SUT SILK 3 0 TIES 17X18 (SUTURE)
SUT SILK 3-0 18XBRD TIE BLK (SUTURE) IMPLANT
SUT VIC AB 0 CT1 27 (SUTURE)
SUT VIC AB 0 CT1 27XBRD ANBCTR (SUTURE) IMPLANT
SUT VIC AB 2-0 SH 27 (SUTURE) ×1
SUT VIC AB 2-0 SH 27XBRD (SUTURE) ×1 IMPLANT
SUT VIC AB 3-0 SH 27 (SUTURE) ×1
SUT VIC AB 3-0 SH 27X BRD (SUTURE) ×1 IMPLANT
SUT VICRYL 0 CT-2 (SUTURE) ×1 IMPLANT
SYR CONTROL 10ML LL (SYRINGE) ×1 IMPLANT
TOWEL GREEN STERILE FF (TOWEL DISPOSABLE) ×1 IMPLANT
TUBE CONNECTING 20X1/4 (TUBING) IMPLANT
YANKAUER SUCT BULB TIP NO VENT (SUCTIONS) IMPLANT

## 2023-01-06 NOTE — Op Note (Signed)
Open right inguinal hernia repair with mesh Procedure Note  Indications: This is a 67 year old male in good health who presents with a right inguinal hernia. The patient has a remote history of an umbilical hernia repair. In 2016 he underwent a robotic right nephrectomy by Dr. Raynelle Bring for renal cell carcinoma. He developed a epigastric incisional hernia. In 2018 Dr. Rosendo Gros performed a laparoscopic mesh repair of the incisional hernia. The patient reports that over the last 3 to 4 months, he has developed an enlarging bulge in his right groin. There is some associated discomfort. The bulge remains reducible. He denies any GI obstructive symptoms. He was having some intermittent right upper quadrant abdominal pain. As part of his workup, his primary care physician ordered a CT scan that showed a normal gallbladder. Incidental finding did show a fat-containing right inguinal hernia with no evidence of left inguinal hernia.   Pre-operative Diagnosis: right reducible inguinal hernia Post-operative Diagnosis: same  Surgeon: [name]   Assistants: Malachi Pro, PA-C  Anesthesia: General endotracheal anesthesia  ASA Class: 1  Procedure Details  The patient was seen again in the Holding Room. The risks, benefits, complications, treatment options, and expected outcomes were discussed with the patient. The possibilities of reaction to medication, pulmonary aspiration, perforation of viscus, bleeding, recurrent infection, the need for additional procedures, and development of a complication requiring transfusion or further operation were discussed with the patient and/or family. The likelihood of success in repairing the hernia and returning the patient to their previous functional status is good.  There was concurrence with the proposed plan, and informed consent was obtained. The site of surgery was properly noted/marked. The patient was taken to the Operating Room, identified as Louis Fox, and the procedure verified as right inguinal hernia repair. A Time Out was held and the above information confirmed.  The patient was placed in the supine position and underwent induction of anesthesia. The lower abdomen and groin was prepped with Chloraprep and draped in the standard fashion, and 0.25% Marcaine with epinephrine was used to anesthetize the skin over the mid-portion of the inguinal canal. An oblique incision was made. Dissection was carried down through the subcutaneous tissue with cautery to the external oblique fascia.  We opened the external oblique fascia along the direction of its fibers to the external ring.  The spermatic cord was circumferentially dissected bluntly and retracted with a Penrose drain.  The ilioinguinal nerve was identified and preserved.  The floor of the inguinal canal was inspected and was intact.  We skeletonized the spermatic cord and reduced a moderate-sized indirect hernia sac.  The internal inguinal ring was tightened with 0 Vicryl.  We used a right Progrip mesh which was inserted and deployed across the floor of the inguinal canal. The mesh was tucked underneath the external oblique fascia laterally.  The flap of the mesh was closed around the spermatic cord to recreate the internal inguinal ring.  The mesh was secured to the pubic tubercle with 0 Vicryl.  Additional stay sutures were used to secure the mesh to the shelving edge inferiorly and to tightened the flap around the spermatic cord. The external oblique fascia was reapproximated with 2-0 Vicryl.  3-0 Vicryl was used to close the subcutaneous tissues and 4-0 Monocryl was used to close the skin in subcuticular fashion.  Benzoin and steri-strips were used to seal the incision.  A clean dressing was applied.  The patient was then extubated and brought to the recovery  room in stable condition.  All sponge, instrument, and needle counts were correct prior to closure and at the conclusion of the  case.   Estimated Blood Loss: Minimal                 Complications: None; patient tolerated the procedure well.         Disposition: PACU - hemodynamically stable.         Condition: stable  Louis Burn. Georgette Dover, MD, [facility]  General Surgery   01/06/2023 11:57 AM

## 2023-01-06 NOTE — Progress Notes (Signed)
Assisted Dr. Marcie Bal with right, transabdominal plane, ultrasound guided block. Side rails up, monitors on throughout procedure. See vital signs in flow sheet. Tolerated Procedure well.

## 2023-01-06 NOTE — Anesthesia Procedure Notes (Addendum)
Anesthesia Regional Block: TAP block   Pre-Anesthetic Checklist: , timeout performed,  Correct Patient, Correct Site, Correct Laterality,  Correct Procedure, Correct Position, site marked,  Risks and benefits discussed,  Surgical consent,  Pre-op evaluation,  At surgeon's request and post-op pain management  Laterality: Right  Prep: chloraprep       Needles:  Injection technique: Single-shot  Needle Type: Echogenic Needle     Needle Length: 9cm  Needle Gauge: 21     Additional Needles:   Narrative:  Start time: 01/06/2023 9:45 AM End time: 01/06/2023 9:53 AM Injection made incrementally with aspirations every 5 mL.  Performed by: Personally  Anesthesiologist: Albertha Ghee, MD  Additional Notes: Pt tolerated the procedure well.

## 2023-01-06 NOTE — Anesthesia Preprocedure Evaluation (Signed)
Anesthesia Evaluation  Patient identified by MRN, date of birth, ID band Patient awake    Reviewed: Allergy & Precautions, H&P , NPO status , Patient's Chart, lab work & pertinent test results  Airway Mallampati: II   Neck ROM: full    Dental   Pulmonary neg pulmonary ROS   breath sounds clear to auscultation       Cardiovascular negative cardio ROS  Rhythm:regular Rate:Normal     Neuro/Psych  PSYCHIATRIC DISORDERS Anxiety        GI/Hepatic ,GERD  ,,  Endo/Other    Renal/GU Renal diseaseH/o renal CA s/p resection 2016     Musculoskeletal  (+) Arthritis ,    Abdominal   Peds  Hematology   Anesthesia Other Findings   Reproductive/Obstetrics                             Anesthesia Physical Anesthesia Plan  ASA: 2  Anesthesia Plan: General   Post-op Pain Management: Regional block*   Induction: Intravenous  PONV Risk Score and Plan: 2 and Ondansetron, Dexamethasone, Midazolam and Treatment may vary due to age or medical condition  Airway Management Planned: LMA  Additional Equipment:   Intra-op Plan:   Post-operative Plan: Extubation in OR  Informed Consent: I have reviewed the patients History and Physical, chart, labs and discussed the procedure including the risks, benefits and alternatives for the proposed anesthesia with the patient or authorized representative who has indicated his/her understanding and acceptance.     Dental advisory given  Plan Discussed with: CRNA, Anesthesiologist and Surgeon  Anesthesia Plan Comments:        Anesthesia Quick Evaluation

## 2023-01-06 NOTE — Anesthesia Procedure Notes (Signed)
Procedure Name: LMA Insertion Date/Time: 01/06/2023 11:05 AM  Performed by: Maryella Shivers, CRNAPre-anesthesia Checklist: Patient identified, Emergency Drugs available, Suction available and Patient being monitored Patient Re-evaluated:Patient Re-evaluated prior to induction Oxygen Delivery Method: Circle system utilized Preoxygenation: Pre-oxygenation with 100% oxygen Induction Type: IV induction Ventilation: Mask ventilation without difficulty LMA: LMA inserted LMA Size: 4.0 Number of attempts: 1 Airway Equipment and Method: Bite block Placement Confirmation: positive ETCO2 Tube secured with: Tape Dental Injury: Teeth and Oropharynx as per pre-operative assessment

## 2023-01-06 NOTE — Discharge Instructions (Addendum)
[facility] _____[facility]   INGUINAL HERNIA REPAIR: POST OP INSTRUCTIONS  Always review your discharge instruction sheet given to you by the facility where your surgery was performed. IF YOU HAVE DISABILITY OR FAMILY LEAVE FORMS, YOU MUST BRING THEM TO THE OFFICE FOR PROCESSING.   DO NOT GIVE THEM TO YOUR DOCTOR.  1. A  prescription for pain medication may be given to you upon discharge.  Take your pain medication as prescribed, if needed.  If narcotic pain medicine is not needed, then you may take acetaminophen (Tylenol) or ibuprofen (Advil) as needed. 2. Take your usually prescribed medications unless otherwise directed. If you need a refill on your pain medication, please contact your pharmacy.  They will contact our office to request authorization. Prescriptions will not be filled after 5 pm or on week-ends. 3. You should follow a light diet the first 24 hours after arrival home, such as soup and crackers, etc.  Be sure to include lots of fluids daily.  Resume your normal diet the day after surgery. 4.Most patients will experience some swelling and bruising in the groin and scrotum.  Ice packs and reclining will help.  Swelling and bruising can take several days to resolve.  6. It is common to experience some constipation if taking pain medication after surgery.  Increasing fluid intake and taking a stool softener (such as Colace) will usually help or prevent this problem from occurring.  A mild laxative (Milk of Magnesia or Miralax) should be taken according to package directions if there are no bowel movements after 48 hours. 7. Unless discharge instructions indicate otherwise, you may remove your bandages 24-48 hours after surgery, and you may shower at that time.  You may have steri-strips (small skin tapes) in place directly over the incision.  These strips should be left on the skin for 7-10 days.  8. ACTIVITIES:  You may resume regular (light) daily activities beginning the  next day--such as daily self-care, walking, climbing stairs--gradually increasing activities as tolerated.  You may have sexual intercourse when it is comfortable.  Refrain from any heavy lifting or straining until approved by your doctor.  a.You may drive when you are no longer taking prescription pain medication, you can comfortably wear a seatbelt, and you can safely maneuver your car and apply brakes. b.RETURN TO WORK:   _____________________________________________  9.You should see your doctor in the office for a follow-up appointment approximately 2-3 weeks after your surgery.  Make sure that you call for this appointment within a day or two after you arrive home to insure a convenient appointment time. 10.OTHER INSTRUCTIONS: _________________________    _____________________________________  WHEN TO CALL YOUR DOCTOR: Fever over 101.0 Inability to urinate Nausea and/or vomiting Extreme swelling or bruising Continued bleeding from incision. Increased pain, redness, or drainage from the incision  The clinic staff is available to answer your questions during regular business hours.  Please don't hesitate to call and ask to speak to one of the nurses for clinical concerns.  If you have a medical emergency, go to the nearest emergency room or call 911.  A surgeon from [facility] is always on call at the hospital   [address] [place], [place], [place]  [address] ?  [address], [place], [place]   [address] [phone] ? [phone] ? FAX [phone] Web site: [email or web address]   Post Anesthesia Home Care Instructions  Activity: Get plenty of rest for the remainder of the day. A responsible  individual must stay with you for 24 hours following the procedure.  For the next 24 hours, DO NOT: -Drive a car -Paediatric nurse -Drink alcoholic beverages -Take any medication unless instructed by your physician -Make any legal decisions or sign  important papers.  Meals: Start with liquid foods such as gelatin or soup. Progress to regular foods as tolerated. Avoid greasy, spicy, heavy foods. If nausea and/or vomiting occur, drink only clear liquids until the nausea and/or vomiting subsides. Call your physician if vomiting continues.  Special Instructions/Symptoms: Your throat may feel dry or sore from the anesthesia or the breathing tube placed in your throat during surgery. If this causes discomfort, gargle with warm salt water. The discomfort should disappear within 24 hours.  If you had a scopolamine patch placed behind your ear for the management of post- operative nausea and/or vomiting:  1. The medication in the patch is effective for 72 hours, after which it should be removed.  Wrap patch in a tissue and discard in the trash. Wash hands thoroughly with soap and water. 2. You may remove the patch earlier than 72 hours if you experience unpleasant side effects which may include dry mouth, dizziness or visual disturbances. 3. Avoid touching the patch. Wash your hands with soap and water after contact with the patch.   Regional Anesthesia Blocks  1. Numbness or the inability to move the "blocked" extremity may last from 3-48 hours after placement. The length of time depends on the medication injected and your individual response to the medication. If the numbness is not going away after 48 hours, call your surgeon.  2. The extremity that is blocked will need to be protected until the numbness is gone and the  Strength has returned. Because you cannot feel it, you will need to take extra care to avoid injury. Because it may be weak, you may have difficulty moving it or using it. You may not know what position it is in without looking at it while the block is in effect.  3. For blocks in the legs and feet, returning to weight bearing and walking needs to be done carefully. You will need to wait until the numbness is entirely gone and the  strength has returned. You should be able to move your leg and foot normally before you try and bear weight or walk. You will need someone to be with you when you first try to ensure you do not fall and possibly risk injury.  4. Bruising and tenderness at the needle site are common side effects and will resolve in a few days.  5. Persistent numbness or new problems with movement should be communicated to the surgeon or the [facility] [phone] [place] [phone]).  May have Tylenol after 3pm if needed.

## 2023-01-06 NOTE — Transfer of Care (Signed)
Immediate Anesthesia Transfer of Care Note  Patient: Louis Fox  Procedure(s) Performed: OPEN RIGHT INGUINAL HERNIA REPAIR WITH MESH (Right: Inguinal)  Patient Location: PACU  Anesthesia Type:GA combined with regional for post-op pain  Level of Consciousness: awake, alert , and oriented  Airway & Oxygen Therapy: Patient Spontanous Breathing and Patient connected to face mask oxygen  Post-op Assessment: Report given to RN and Post -op Vital signs reviewed and stable  Post vital signs: Reviewed and stable  Last Vitals:  Vitals Value Taken Time  BP 156/105 01/06/23 1211  Temp    Pulse 78 01/06/23 1212  Resp 17 01/06/23 1212  SpO2 100 % 01/06/23 1212  Vitals shown include unvalidated device data.  Last Pain:  Vitals:   01/06/23 0905  PainSc: 0-No pain      Patients Stated Pain Goal: 3 (22/63/33 5456)  Complications: No notable events documented.

## 2023-01-06 NOTE — Interval H&P Note (Signed)
History and Physical Interval Note:  01/06/2023 9:28 AM  Louis Fox  has presented today for surgery, with the diagnosis of RIGHT INGUINAL HERNIA.  The various methods of treatment have been discussed with the patient and family. After consideration of risks, benefits and other options for treatment, the patient has consented to  Procedure(s): OPEN RIGHT INGUINAL HERNIA REPAIR WITH MESH (Right) as a surgical intervention.  The patient's history has been reviewed, patient examined, no change in status, stable for surgery.  I have reviewed the patient's chart and labs.  Questions were answered to the patient's satisfaction.     [name]

## 2023-01-07 ENCOUNTER — Encounter (HOSPITAL_BASED_OUTPATIENT_CLINIC_OR_DEPARTMENT_OTHER): Payer: Self-pay | Admitting: Surgery

## 2023-01-07 NOTE — Anesthesia Postprocedure Evaluation (Signed)
Anesthesia Post Note  Patient: Louis Fox  Procedure(s) Performed: OPEN RIGHT INGUINAL HERNIA REPAIR WITH MESH (Right: Inguinal)     Patient location during evaluation: PACU Anesthesia Type: General and Regional Level of consciousness: awake and alert Pain management: pain level controlled Vital Signs Assessment: post-procedure vital signs reviewed and stable Respiratory status: spontaneous breathing, nonlabored ventilation, respiratory function stable and patient connected to nasal cannula oxygen Cardiovascular status: blood pressure returned to baseline and stable Postop Assessment: no apparent nausea or vomiting Anesthetic complications: no   No notable events documented.  Last Vitals:  Vitals:   01/06/23 1230 01/06/23 1256  BP:  (!) 145/99  Pulse: 71 71  Resp: (!) 25 20  Temp:  36.5 C  SpO2: 99% 98%    Last Pain:  Vitals:   01/06/23 1256  TempSrc: Tympanic  PainSc: 0-No pain                 Kayvon Mo S

## 2023-01-29 ENCOUNTER — Ambulatory Visit: Payer: Medicare Other | Admitting: Family Medicine

## 2023-02-05 ENCOUNTER — Ambulatory Visit: Payer: Medicare Other | Admitting: Family Medicine

## 2023-04-02 ENCOUNTER — Ambulatory Visit (INDEPENDENT_AMBULATORY_CARE_PROVIDER_SITE_OTHER): Payer: Medicare Other | Admitting: Family Medicine

## 2023-04-02 ENCOUNTER — Encounter: Payer: Self-pay | Admitting: Family Medicine

## 2023-04-02 VITALS — BP 110/80 | HR 79 | Temp 97.7°F | Resp 16 | Ht 67.0 in | Wt 160.4 lb

## 2023-04-02 DIAGNOSIS — E663 Overweight: Secondary | ICD-10-CM

## 2023-04-02 DIAGNOSIS — Z125 Encounter for screening for malignant neoplasm of prostate: Secondary | ICD-10-CM

## 2023-04-02 DIAGNOSIS — C641 Malignant neoplasm of right kidney, except renal pelvis: Secondary | ICD-10-CM | POA: Diagnosis not present

## 2023-04-02 LAB — BASIC METABOLIC PANEL
BUN: 15 mg/dL (ref 6–23)
CO2: 29 mEq/L (ref 19–32)
Calcium: 9.8 mg/dL (ref 8.4–10.5)
Chloride: 100 mEq/L (ref 96–112)
Creatinine, Ser: 0.81 mg/dL (ref 0.40–1.50)
GFR: 90.76 mL/min (ref >60.00)
Glucose, Bld: 67 mg/dL — ABNORMAL LOW (ref 70–99)
Potassium: 4.4 mEq/L (ref 3.5–5.1)
Sodium: 135 mEq/L (ref 135–145)

## 2023-04-02 LAB — CBC WITH DIFFERENTIAL/PLATELET
Basophils Absolute: 0.1 10*3/uL (ref 0.0–0.1)
Basophils Relative: 1 % (ref 0.0–3.0)
Eosinophils Absolute: 0.2 10*3/uL (ref 0.0–0.7)
Eosinophils Relative: 1.9 % (ref 0.0–5.0)
HCT: 43.4 % (ref 39.0–52.0)
Hemoglobin: 15 g/dL (ref 13.0–17.0)
Lymphocytes Relative: 19.2 % (ref 12.0–46.0)
Lymphs Abs: 1.6 10*3/uL (ref 0.7–4.0)
MCHC: 34.6 g/dL (ref 30.0–36.0)
MCV: 92.2 fl (ref 78.0–100.0)
Monocytes Absolute: 0.5 10*3/uL (ref 0.1–1.0)
Monocytes Relative: 6.6 % (ref 3.0–12.0)
Neutro Abs: 5.8 10*3/uL (ref 1.4–7.7)
Neutrophils Relative %: 71.3 % (ref 43.0–77.0)
Platelets: 264 10*3/uL (ref 150.0–400.0)
RBC: 4.71 Mil/uL (ref 4.22–5.81)
RDW: 12.7 % (ref 11.5–15.5)
WBC: 8.1 10*3/uL (ref 4.0–10.5)

## 2023-04-02 LAB — HEPATIC FUNCTION PANEL
ALT: 9 U/L (ref 0–53)
AST: 15 U/L (ref 0–37)
Albumin: 4.5 g/dL (ref 3.5–5.2)
Alkaline Phosphatase: 56 U/L (ref 39–117)
Bilirubin, Direct: 0.1 mg/dL (ref 0.0–0.3)
Total Bilirubin: 0.7 mg/dL (ref 0.2–1.2)
Total Protein: 6.6 g/dL (ref 6.0–8.3)

## 2023-04-02 LAB — LIPID PANEL
Cholesterol: 195 mg/dL (ref 0–200)
HDL: 48.2 mg/dL (ref >39.00)
NonHDL: 146.51
Total CHOL/HDL Ratio: 4
Triglycerides: 239 mg/dL — ABNORMAL HIGH (ref 0.0–149.0)
VLDL: 47.8 mg/dL — ABNORMAL HIGH (ref 0.0–40.0)

## 2023-04-02 LAB — PSA, MEDICARE: PSA: 3.56 ng/ml (ref 0.10–4.00)

## 2023-04-02 LAB — LDL CHOLESTEROL, DIRECT: Direct LDL: 103 mg/dL

## 2023-04-02 NOTE — Assessment & Plan Note (Signed)
Pt has been released from Urology.  No routine follow up needed

## 2023-04-02 NOTE — Assessment & Plan Note (Signed)
Pt has lost 6 lbs since his last visit.  BMI now 25.12  Is exercising more regularly now that knees feel better.  Is eating better and has decreased his alcohol intake.  Applauded his efforts.  Will follow.

## 2023-04-02 NOTE — Patient Instructions (Signed)
Follow up in 1 year or as needed We'll notify you of your lab results and make any changes if needed Keep up the good work on healthy diet and regular exercise- you look great!!! Call with any questions or concerns Stay Safe!  Stay Healthy! Happy Spring!!! 

## 2023-04-02 NOTE — Progress Notes (Signed)
   Subjective:    Patient ID: Louis Fox, male    DOB: 1956-02-13, 67 y.o.   MRN: [id number]  HPI Overweight- pt is down 6 lbs since last visit.  BMI now 25.12.  pt reports regular exercise- knees have improved w/ cortisone injection.  Pt reports eating well.  Will drink ~1 beer daily.  No CP, SOB, HA's, abd pain, N/V.  Renal cell carcinoma- pt has been released from Urology follow up.   Review of Systems For ROS see HPI     Objective:   Physical Exam Vitals reviewed.  Constitutional:      General: He is not in acute distress.    Appearance: Normal appearance. He is well-developed. He is not ill-appearing.  HENT:     Head: Normocephalic and atraumatic.  Eyes:     Extraocular Movements: Extraocular movements intact.     Conjunctiva/sclera: Conjunctivae normal.     Pupils: Pupils are equal, round, and reactive to light.  Neck:     Thyroid: No thyromegaly.  Cardiovascular:     Rate and Rhythm: Normal rate and regular rhythm.     Pulses: Normal pulses.     Heart sounds: Normal heart sounds. No murmur heard. Pulmonary:     Effort: Pulmonary effort is normal. No respiratory distress.     Breath sounds: Normal breath sounds.  Abdominal:     General: Bowel sounds are normal. There is no distension.     Palpations: Abdomen is soft.  Musculoskeletal:     Cervical back: Normal range of motion and neck supple.     Right lower leg: No edema.     Left lower leg: No edema.  Lymphadenopathy:     Cervical: No cervical adenopathy.  Skin:    General: Skin is warm and dry.  Neurological:     General: No focal deficit present.     Mental Status: He is alert and oriented to person, place, and time.     Cranial Nerves: No cranial nerve deficit.  Psychiatric:        Mood and Affect: Mood normal.        Behavior: Behavior normal.           Assessment & Plan:

## 2023-04-05 ENCOUNTER — Telehealth: Payer: Self-pay

## 2023-04-05 ENCOUNTER — Other Ambulatory Visit: Payer: Self-pay

## 2023-04-05 DIAGNOSIS — R972 Elevated prostate specific antigen [PSA]: Secondary | ICD-10-CM

## 2023-04-05 NOTE — Telephone Encounter (Signed)
Inforemd pt of lab results and lab only visit has been made for 10/05/23. Repeat PSA order is in

## 2023-04-05 NOTE — Telephone Encounter (Signed)
-----   Message from Sheliah Hatch, MD sent at 04/05/2023  7:43 AM EDT ----- Labs look great!  Your PSA is still well within normal range but is higher than it was a year ago.  To ensure this is stable and not continuing to climb, we'll repeat your PSA level at a lab only visit in 6 months (dx R97.20)

## 2023-09-12 DIAGNOSIS — Z23 Encounter for immunization: Secondary | ICD-10-CM | POA: Diagnosis not present

## 2023-09-15 ENCOUNTER — Ambulatory Visit (INDEPENDENT_AMBULATORY_CARE_PROVIDER_SITE_OTHER): Payer: Medicare Other | Admitting: *Deleted

## 2023-09-15 DIAGNOSIS — Z0000 Encounter for general adult medical examination without abnormal findings: Secondary | ICD-10-CM

## 2023-09-15 NOTE — Progress Notes (Signed)
Subjective:   Louis Fox is a 67 y.o. male who presents for Medicare Annual/Subsequent preventive examination.  Visit Complete: Virtual  I connected with  Louis Fox on 09/15/23 by a audio enabled telemedicine application and verified that I am speaking with the correct person using two identifiers.  Patient Location: Home  Provider Location: Home Office  I discussed the limitations of evaluation and management by telemedicine. The patient expressed understanding and agreed to proceed.  Vital Signs: Unable to obtain new vitals due to this being a telehealth visit.        Objective:    There were no vitals filed for this visit. There is no height or weight on file to calculate BMI.     09/15/2023   10:41 AM 01/06/2023    9:03 AM 12/31/2022   11:10 AM 08/26/2022    2:34 PM 11/25/2015    4:00 PM 11/25/2015    9:53 AM 11/07/2015   10:20 AM  Advanced Directives  Does Patient Have a Medical Advance Directive? Yes Yes Yes Yes Yes Yes No  Type of Diplomatic Services operational officer  Living will Healthcare Power of [place];Living will Healthcare Power of [place];Living will Healthcare Power of [place];Living will   Does patient want to make changes to medical advance directive?   No - Patient declined  No - Patient declined No - Patient declined   Copy of Healthcare Power of Attorney in Chart? No - copy requested  No - copy requested No - copy requested  Yes   Would patient like information on creating a medical advance directive?  No - Patient declined   No - patient declined information  No - patient declined information    Current Medications (verified) Outpatient Encounter Medications as of 09/15/2023  Medication Sig   acetaminophen (TYLENOL) 500 MG tablet Take 1,000 mg by mouth daily as needed for mild pain.   ibuprofen (ADVIL) 600 MG tablet Take 600 mg by mouth every 6 (six) hours as needed.   TURMERIC PO Take by mouth.   No facility-administered  encounter medications on file as of 09/15/2023.    Allergies (verified) Shellfish allergy and Fenofibrate   History: Past Medical History:  Diagnosis Date   Arthritis    Family history of punctured lung    patient with hx of puncture lung as a teenager - left lung    GERD (gastroesophageal reflux disease)    hx of   History of kidney cancer    Pancreatitis    Pseudogout    Past Surgical History:  Procedure Laterality Date   ESOPHAGOGASTRODUODENOSCOPY N/A 09/28/2015   Procedure: ESOPHAGOGASTRODUODENOSCOPY (EGD);  Surgeon: Louis Meckel, MD;  Location[facility] ENDOSCOPY;  Service: Endoscopy;  Laterality: N/A;   INGUINAL HERNIA REPAIR Right 01/06/2023   Procedure: OPEN RIGHT INGUINAL HERNIA REPAIR WITH MESH;  Surgeon: Manus Rudd, MD;  Location: [facility];  Service: General;  Laterality: Right;   ROBOT ASSISTED LAPAROSCOPIC NEPHRECTOMY Right 11/25/2015   Procedure: ROBOTIC ASSISTED LAPAROSCOPIC  PARTIAL NEPHRECTOMY;  Surgeon: Heloise Purpura, MD;  Location: [facility];  Service: Urology;  Laterality: Right;   umblical hernia     Family History  Problem Relation Age of Onset   Stroke Mother    Hypertension Brother    Cancer Brother        kidney   Social History   Socioeconomic History   Marital status: Married    Spouse name: Not on file   Number of  children: Not on file   Years of education: Not on file   Highest education level: Not on file  Occupational History   Not on file  Tobacco Use   Smoking status: Never   Smokeless tobacco: Never  Vaping Use   Vaping status: Never Used  Substance and Sexual Activity   Alcohol use: Yes    Comment: daily beer   Drug use: No   Sexual activity: Not Currently  Other Topics Concern   Not on file  Social History Narrative   Not on file   Social Determinants of Health   Financial Resource Strain: Low Risk  (09/15/2023)   Overall Financial Resource Strain (CARDIA)    Difficulty of Paying Living Expenses: Not hard  at all  Food Insecurity: No Food Insecurity (09/15/2023)   Hunger Vital Sign    Worried About Running Out of Food in the Last Year: Never true    Ran Out of Food in the Last Year: Never true  Transportation Needs: No Transportation Needs (09/15/2023)   PRAPARE - Administrator, Civil Service (Medical): No    Lack of Transportation (Non-Medical): No  Physical Activity: Sufficiently Active (09/15/2023)   Exercise Vital Sign    Days of Exercise per Week: 4 days    Minutes of Exercise per Session: 50 min  Stress: No Stress Concern Present (09/15/2023)   [employer] of Occupational Health - Occupational Stress Questionnaire    Feeling of Stress : Not at all  Social Connections: Moderately Integrated (09/15/2023)   Social Connection and Isolation Panel [NHANES]    Frequency of Communication with Friends and Family: More than three times a week    Frequency of Social Gatherings with Friends and Family: Twice a week    Attends Religious Services: Never    Database administrator or Organizations: Yes    Attends Engineer, structural: More than 4 times per year    Marital Status: Married    Tobacco Counseling Counseling given: Not Answered   Clinical Intake:  Pre-visit preparation completed: Yes  Pain : No/denies pain     Diabetes: No  How often do you need to have someone help you when you read instructions, pamphlets, or other written materials from your doctor or pharmacy?: 1 - Never  Interpreter Needed?: No  Information entered by :: Remi Haggard LPN   Activities of Daily Living    04/02/2023    1:06 PM 01/06/2023    9:06 AM  In your present state of health, do you have any difficulty performing the following activities:  Hearing? 0 0  Vision? 0 0  Difficulty concentrating or making decisions? 0 1  Walking or climbing stairs? 0 0  Dressing or bathing? 0 1  Doing errands, shopping? 0     Patient Care Team: Sheliah Hatch, MD as PCP - General  (Family Medicine) Heloise Purpura, MD as Consulting Physician (Urology) Jeani Hawking, MD as Consulting Physician (Gastroenterology)  Indicate any recent Medical Services you may have received from other than [facility] providers in the past year (date may be approximate).     Assessment:   This is a routine wellness examination for [place].  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 0 0 1 0 0  Number falls in past yr: 0 0 0  0  Injury with Fall? 0 0 0  0  Risk for fall due to :   History of fall(s) No Fall Risks No Fall Risks  Follow up Falls evaluation completed;Education provided;Falls prevention discussed Falls evaluation completed;Education provided;Falls prevention discussed Falls evaluation completed Falls evaluation completed Falls prevention discussed    MEDICARE RISK AT HOME: Medicare Risk at Home Any stairs in or around the home?: Yes If so, are there any without handrails?: No Home free of loose throw rugs in walkways, pet beds, electrical cords, etc?: Yes Adequate lighting in your home to reduce risk of falls?: Yes Life alert?: No Use of a cane, walker or w/c?: No Grab bars in the bathroom?: Yes Shower chair or bench in shower?: Yes Elevated toilet seat or a handicapped toilet?: No  TIMED UP AND GO:  Was the test performed?  No    Cognitive Function:         09/15/2023   10:43 AM 08/26/2022    2:34 PM 11/08/2020    9:23 AM  6CIT Screen  What Year? 0 points 0 points 0 points  What month? 0 points 0 points 0 points  What time? 0 points 0 points 0 points  Count back from 20 0 points 0 points 0 points  Months in reverse 0 points 0 points 0 points  Repeat phrase 0 points 0 points 0 points  Total Score 0 points 0 points 0 points    Immunizations Immunization History  Administered Date(s) Administered   Fluad Quad(high Dose 65+) 11/01/2020, 10/31/2022, 09/12/2023   Influenza,inj,Quad PF,6+ Mos 11/04/2018, 11/07/2019, 10/03/2021   Influenza-Unspecified 10/31/2022   Moderna Sars-Covid-2 Vaccination 01/28/2020, 02/25/2020, 11/01/2020   PFIZER Comirnaty(Gray Top)Covid-19 Tri-Sucrose Vaccine 10/31/2022   PFIZER(Purple Top)SARS-COV-2 Vaccination 10/31/2022, 09/12/2023   PNEUMOCOCCAL CONJUGATE-20 10/03/2021   Tdap 12/17/2014   Zoster Recombinant(Shingrix) 03/21/2020    TDAP status: Up to date  Flu Vaccine status: Up to date  Pneumococcal vaccine status: Up to date  Covid-19 vaccine status: Completed vaccines  Qualifies for Shingles Vaccine? Yes   Zostavax completed No   Shingrix Completed?: No.    Education has been provided regarding the importance of this vaccine. Patient has been advised to call insurance company to determine out of pocket expense if they have not yet received this vaccine. Advised may also receive vaccine at local pharmacy or Health Dept. Verbalized acceptance and understanding.  Screening Tests Health Maintenance  Topic Date Due   Medicare Annual Wellness (AWV)  09/14/2024   DTaP/Tdap/Td (2 - Td or Tdap) 12/17/2024   Colonoscopy  05/09/2028   Pneumonia Vaccine 45+ Years old  Completed   INFLUENZA VACCINE  Completed   Hepatitis C  Screening  Completed   HPV VACCINES  Aged Out   COVID-19 Vaccine  Discontinued   Zoster Vaccines- Shingrix  Discontinued    Health Maintenance  There are no preventive care reminders  to display for this patient.   Colorectal cancer screening: Type of screening: Colonoscopy. Completed 2019. Repeat every 10 years  Lung Cancer Screening: (Low Dose CT Chest recommended if Age 57-80 years, 20 pack-year currently smoking OR have quit w/in 15years.) does not qualify.   Lung Cancer Screening Referral:   Additional Screening:  Hepatitis C Screening: does not qualify; Completed 2018  Vision Screening: Recommended annual ophthalmology exams for early detection of glaucoma and other disorders of the eye. Is the patient up to date with their annual eye exam?  Yes  Who is the provider or what is the name of the office in which the patient attends annual eye exams? Has readers does not have any issues If pt is not established with a provider, would they like to be referred to a provider to establish care? No .   Dental Screening: Recommended annual dental exams for proper oral hygiene   Community Resource Referral / Chronic Care Management: CRR required this visit?  No   CCM required this visit?  No     Plan:     I have personally reviewed and noted the following in the patient's chart:   Medical and social history Use of alcohol, tobacco or illicit drugs  Current medications and supplements including opioid prescriptions. Patient is not currently taking opioid prescriptions. Functional ability and status Nutritional status Physical activity Advanced directives List of other physicians Hospitalizations, surgeries, and ER visits in previous 12 months Vitals Screenings to include cognitive, depression, and falls Referrals and appointments  In addition, I have reviewed and discussed with patient certain preventive protocols, quality metrics, and best practice recommendations. A written personalized care plan for preventive services as well as general preventive health recommendations were provided to patient.     Remi Haggard, LPN   [id number]   After Visit  Summary: (MyChart) Due to this being a telephonic visit, the after visit summary with patients personalized plan was offered to patient via MyChart   Nurse Notes:

## 2023-09-15 NOTE — Patient Instructions (Signed)
Louis Fox , Thank you for taking time to come for your Medicare Wellness Visit. I appreciate your ongoing commitment to your health goals. Please review the following plan we discussed and let me know if I can assist you in the future.   Screening recommendations/referrals: Colonoscopy: up to date Recommended yearly ophthalmology/optometry visit for glaucoma screening and checkup Recommended yearly dental visit for hygiene and checkup  Vaccinations: Influenza vaccine: up to date Pneumococcal vaccine: up to date Tdap vaccine: up to date Shingles vaccine: Education provided    Advanced directives: yes not on file    Preventive Care 65 Years and Older, Male Preventive care refers to lifestyle choices and visits with your health care provider that can promote health and wellness. What does preventive care include? A yearly physical exam. This is also called an annual well check. Dental exams once or twice a year. Routine eye exams. Ask your health care provider how often you should have your eyes checked. Personal lifestyle choices, including: Daily care of your teeth and gums. Regular physical activity. Eating a healthy diet. Avoiding tobacco and drug use. Limiting alcohol use. Practicing safe sex. Taking low doses of aspirin every day. Taking vitamin and mineral supplements as recommended by your health care provider. What happens during an annual well check? The services and screenings done by your health care provider during your annual well check will depend on your age, overall health, lifestyle risk factors, and family history of disease. Counseling  Your health care provider may ask you questions about your: Alcohol use. Tobacco use. Drug use. Emotional well-being. Home and relationship well-being. Sexual activity. Eating habits. History of falls. Memory and ability to understand (cognition). Work and work Astronomer. Screening  You may have the following tests  or measurements: Height, weight, and BMI. Blood pressure. Lipid and cholesterol levels. These may be checked every 5 years, or more frequently if you are over 61 years old. Skin check. Lung cancer screening. You may have this screening every year starting at age 68 if you have a 30-pack-year history of smoking and currently smoke or have quit within the past 15 years. Fecal occult blood test (FOBT) of the stool. You may have this test every year starting at age 37. Flexible sigmoidoscopy or colonoscopy. You may have a sigmoidoscopy every 5 years or a colonoscopy every 10 years starting at age 87. Prostate cancer screening. Recommendations will vary depending on your family history and other risks. Hepatitis C blood test. Hepatitis B blood test. Sexually transmitted disease (STD) testing. Diabetes screening. This is done by checking your blood sugar (glucose) after you have not eaten for a while (fasting). You may have this done every 1-3 years. Abdominal aortic aneurysm (AAA) screening. You may need this if you are a current or former smoker. Osteoporosis. You may be screened starting at age 74 if you are at high risk. Talk with your health care provider about your test results, treatment options, and if necessary, the need for more tests. Vaccines  Your health care provider may recommend certain vaccines, such as: Influenza vaccine. This is recommended every year. Tetanus, diphtheria, and acellular pertussis (Tdap, Td) vaccine. You may need a Td booster every 10 years. Zoster vaccine. You may need this after age 47. Pneumococcal 13-valent conjugate (PCV13) vaccine. One dose is recommended after age 67. Pneumococcal polysaccharide (PPSV23) vaccine. One dose is recommended after age 50. Talk to your health care provider about which screenings and vaccines you need and how often you need  them. This information is not intended to replace advice given to you by your health care provider. Make  sure you discuss any questions you have with your health care provider. Document Released: 01/10/2016 Document Revised: 09/02/2016 Document Reviewed: 10/15/2015 Elsevier Interactive Patient Education  2017 [employer].  Fall Prevention in the Home Falls can cause injuries. They can happen to people of all ages. There are many things you can do to make your home safe and to help prevent falls. What can I do on the outside of my home? Regularly fix the edges of walkways and driveways and fix any cracks. Remove anything that might make you trip as you walk through a door, such as a raised step or threshold. Trim any bushes or trees on the path to your home. Use bright outdoor lighting. Clear any walking paths of anything that might make someone trip, such as rocks or tools. Regularly check to see if handrails are loose or broken. Make sure that both sides of any steps have handrails. Any raised decks and porches should have guardrails on the edges. Have any leaves, snow, or ice cleared regularly. Use sand or salt on walking paths during winter. Clean up any spills in your garage right away. This includes oil or grease spills. What can I do in the bathroom? Use night lights. Install grab bars by the toilet and in the tub and shower. Do not use towel bars as grab bars. Use non-skid mats or decals in the tub or shower. If you need to sit down in the shower, use a plastic, non-slip stool. Keep the floor dry. Clean up any water that spills on the floor as soon as it happens. Remove soap buildup in the tub or shower regularly. Attach bath mats securely with double-sided non-slip rug tape. Do not have throw rugs and other things on the floor that can make you trip. What can I do in the bedroom? Use night lights. Make sure that you have a light by your bed that is easy to reach. Do not use any sheets or blankets that are too big for your bed. They should not hang down onto the floor. Have a firm  chair that has side arms. You can use this for support while you get dressed. Do not have throw rugs and other things on the floor that can make you trip. What can I do in the kitchen? Clean up any spills right away. Avoid walking on wet floors. Keep items that you use a lot in easy-to-reach places. If you need to reach something above you, use a strong step stool that has a grab bar. Keep electrical cords out of the way. Do not use floor polish or wax that makes floors slippery. If you must use wax, use non-skid floor wax. Do not have throw rugs and other things on the floor that can make you trip. What can I do with my stairs? Do not leave any items on the stairs. Make sure that there are handrails on both sides of the stairs and use them. Fix handrails that are broken or loose. Make sure that handrails are as long as the stairways. Check any carpeting to make sure that it is firmly attached to the stairs. Fix any carpet that is loose or worn. Avoid having throw rugs at the top or bottom of the stairs. If you do have throw rugs, attach them to the floor with carpet tape. Make sure that you have a light switch at  the top of the stairs and the bottom of the stairs. If you do not have them, ask someone to add them for you. What else can I do to help prevent falls? Wear shoes that: Do not have high heels. Have rubber bottoms. Are comfortable and fit you well. Are closed at the toe. Do not wear sandals. If you use a stepladder: Make sure that it is fully opened. Do not climb a closed stepladder. Make sure that both sides of the stepladder are locked into place. Ask someone to hold it for you, if possible. Clearly mark and make sure that you can see: Any grab bars or handrails. First and last steps. Where the edge of each step is. Use tools that help you move around (mobility aids) if they are needed. These include: Canes. Walkers. Scooters. Crutches. Turn on the lights when you go  into a dark area. Replace any light bulbs as soon as they burn out. Set up your furniture so you have a clear path. Avoid moving your furniture around. If any of your floors are uneven, fix them. If there are any pets around you, be aware of where they are. Review your medicines with your doctor. Some medicines can make you feel dizzy. This can increase your chance of falling. Ask your doctor what other things that you can do to help prevent falls. This information is not intended to replace advice given to you by your health care provider. Make sure you discuss any questions you have with your health care provider. Document Released: 10/10/2009 Document Revised: 05/21/2016 Document Reviewed: 01/18/2015 Elsevier Interactive Patient Education  2017 [employer].

## 2023-09-21 NOTE — Progress Notes (Signed)
Subjective:   Louis Fox is a 67 y.o. male who presents for Medicare Annual/Subsequent preventive examination.  Visit Complete: Virtual  I connected with  Louis Fox on 09-15-2023 by a audio enabled telemedicine application and verified that I am speaking with the correct person using two identifiers.  Patient Location: Home  Provider Location: Home Office  I discussed the limitations of evaluation and management by telemedicine. The patient expressed understanding and agreed to proceed.  Vital Signs: Unable to obtain new vitals due to this being a telehealth visit.  Cardiac Risk Factors include: advanced age (>54men, >69 women);obesity (BMI >30kg/m2);male gender     Objective:    There were no vitals filed for this visit. There is no height or weight on file to calculate BMI.     09/15/2023   10:41 AM 01/06/2023    9:03 AM 12/31/2022   11:10 AM 08/26/2022    2:34 PM 11/25/2015    4:00 PM 11/25/2015    9:53 AM 11/07/2015   10:20 AM  Advanced Directives  Does Patient Have a Medical Advance Directive? Yes Yes Yes Yes Yes Yes No  Type of Diplomatic Services operational officer  Living will Healthcare Power of [place];Living will Healthcare Power of [place];Living will Healthcare Power of [place];Living will   Does patient want to make changes to medical advance directive?   No - Patient declined  No - Patient declined No - Patient declined   Copy of Healthcare Power of Attorney in Chart? No - copy requested  No - copy requested No - copy requested  Yes   Would patient like information on creating a medical advance directive?  No - Patient declined   No - patient declined information  No - patient declined information    Current Medications (verified) Outpatient Encounter Medications as of 09/15/2023  Medication Sig   acetaminophen (TYLENOL) 500 MG tablet Take 1,000 mg by mouth daily as needed for mild pain.   ibuprofen (ADVIL) 600 MG tablet Take 600 mg by  mouth every 6 (six) hours as needed.   TURMERIC PO Take by mouth.   No facility-administered encounter medications on file as of 09/15/2023.    Allergies (verified) Shellfish allergy and Fenofibrate   History: Past Medical History:  Diagnosis Date   Arthritis    Family history of punctured lung    patient with hx of puncture lung as a teenager - left lung    GERD (gastroesophageal reflux disease)    hx of   History of kidney cancer    Pancreatitis    Pseudogout    Past Surgical History:  Procedure Laterality Date   ESOPHAGOGASTRODUODENOSCOPY N/A 09/28/2015   Procedure: ESOPHAGOGASTRODUODENOSCOPY (EGD);  Surgeon: Louis Meckel, MD;  Location[facility] ENDOSCOPY;  Service: Endoscopy;  Laterality: N/A;   INGUINAL HERNIA REPAIR Right 01/06/2023   Procedure: OPEN RIGHT INGUINAL HERNIA REPAIR WITH MESH;  Surgeon: Manus Rudd, MD;  Location: [facility];  Service: General;  Laterality: Right;   ROBOT ASSISTED LAPAROSCOPIC NEPHRECTOMY Right 11/25/2015   Procedure: ROBOTIC ASSISTED LAPAROSCOPIC  PARTIAL NEPHRECTOMY;  Surgeon: Heloise Purpura, MD;  Location: [facility];  Service: Urology;  Laterality: Right;   umblical hernia     Family History  Problem Relation Age of Onset   Stroke Mother    Hypertension Brother    Cancer Brother        kidney   Social History   Socioeconomic History   Marital status: Married  Spouse name: Not on file   Number of children: Not on file   Years of education: Not on file   Highest education level: Not on file  Occupational History   Not on file  Tobacco Use   Smoking status: Never   Smokeless tobacco: Never  Vaping Use   Vaping status: Never Used  Substance and Sexual Activity   Alcohol use: Yes    Comment: daily beer   Drug use: No   Sexual activity: Not Currently  Other Topics Concern   Not on file  Social History Narrative   Not on file   Social Determinants of Health   Financial Resource Strain: Low Risk  (09/15/2023)    Overall Financial Resource Strain (CARDIA)    Difficulty of Paying Living Expenses: Not hard at all  Food Insecurity: No Food Insecurity (09/15/2023)   Hunger Vital Sign    Worried About Running Out of Food in the Last Year: Never true    Ran Out of Food in the Last Year: Never true  Transportation Needs: No Transportation Needs (09/15/2023)   PRAPARE - Administrator, Civil Service (Medical): No    Lack of Transportation (Non-Medical): No  Physical Activity: Sufficiently Active (09/15/2023)   Exercise Vital Sign    Days of Exercise per Week: 4 days    Minutes of Exercise per Session: 50 min  Stress: No Stress Concern Present (09/15/2023)   [employer] of Occupational Health - Occupational Stress Questionnaire    Feeling of Stress : Not at all  Social Connections: Moderately Integrated (09/15/2023)   Social Connection and Isolation Panel [NHANES]    Frequency of Communication with Friends and Family: More than three times a week    Frequency of Social Gatherings with Friends and Family: Twice a week    Attends Religious Services: Never    Database administrator or Organizations: Yes    Attends Engineer, structural: More than 4 times per year    Marital Status: Married    Tobacco Counseling Counseling given: Not Answered   Clinical Intake:  Pre-visit preparation completed: Yes  Pain : No/denies pain     Diabetes: No  How often do you need to have someone help you when you read instructions, pamphlets, or other written materials from your doctor or pharmacy?: 1 - Never  Interpreter Needed?: No  Information entered by :: Remi Haggard LPN   Activities of Daily Living    09/21/2023    9:33 AM 04/02/2023    1:06 PM  In your present state of health, do you have any difficulty performing the following activities:  Hearing? 0 0  Vision? 0 0  Difficulty concentrating or making decisions? 0 0  Walking or climbing stairs? 0 0  Dressing or bathing? 0 0   Doing errands, shopping? 0 0  Preparing Food and eating ? N   Using the Toilet? N   In the past six months, have you accidently leaked urine? N   Do you have problems with loss of bowel control? N   Managing your Medications? N   Managing your Finances? N   Housekeeping or managing your Housekeeping? N     Patient Care Team: Sheliah Hatch, MD as PCP - General (Family Medicine) Heloise Purpura, MD as Consulting Physician (Urology) Jeani Hawking, MD as Consulting Physician (Gastroenterology)  Indicate any recent Medical Services you may have received from other than [facility] providers in the past year (date may be  approximate).     Assessment:   This is a routine wellness examination for Louis Fox.  Hearing/Vision screen Hearing Screening - Comments:: No trouble hearing Vision Screening - Comments:: Not up to date No issue has readers   Goals Addressed             This Visit's Progress    Patient Stated       Organize shop        Depression Screen    09/15/2023   10:46 AM 04/02/2023    1:05 PM 11/23/2022    1:05 PM 08/26/2022    2:33 PM 01/28/2022   10:01 AM 06/24/2021   10:30 AM 11/08/2020    9:04 AM  PHQ 2/9 Scores  PHQ - 2 Score 0 0 0 0 0 0 0  PHQ- 9 Score 0 0 0  0  0    Fall Risk    09/15/2023   10:43 AM 09/15/2023   10:41 AM 04/02/2023    1:05 PM 11/23/2022    1:06 PM 08/26/2022    2:31 PM  Fall Risk   Falls in the past year? 0 0 1 0 0  Number falls in past yr: 0 0 0  0  Injury with Fall? 0 0 0  0  Risk for fall due to :   History of fall(s) No Fall Risks No Fall Risks  Follow up Falls evaluation completed;Education provided;Falls prevention discussed Falls evaluation completed;Education provided;Falls prevention discussed Falls evaluation completed Falls evaluation completed Falls prevention discussed    MEDICARE RISK AT HOME: Medicare Risk at Home Any stairs in or around the home?: Yes If so, are there any without handrails?: No Home free of loose  throw rugs in walkways, pet beds, electrical cords, etc?: Yes Adequate lighting in your home to reduce risk of falls?: Yes Life alert?: No Use of a cane, walker or w/c?: No Grab bars in the bathroom?: Yes Shower chair or bench in shower?: Yes Elevated toilet seat or a handicapped toilet?: No  TIMED UP AND GO:  Was the test performed?  No    Cognitive Function:        09/15/2023   10:43 AM 08/26/2022    2:34 PM 11/08/2020    9:23 AM  6CIT Screen  What Year? 0 points 0 points 0 points  What month? 0 points 0 points 0 points  What time? 0 points 0 points 0 points  Count back from 20 0 points 0 points 0 points  Months in reverse 0 points 0 points 0 points  Repeat phrase 0 points 0 points 0 points  Total Score 0 points 0 points 0 points    Immunizations Immunization History  Administered Date(s) Administered   Fluad Quad(high Dose 65+) 11/01/2020, 10/31/2022, 09/12/2023   Influenza,inj,Quad PF,6+ Mos 11/04/2018, 11/07/2019, 10/03/2021   Influenza-Unspecified 10/31/2022   Moderna Sars-Covid-2 Vaccination 01/28/2020, 02/25/2020, 11/01/2020   PFIZER Comirnaty(Gray Top)Covid-19 Tri-Sucrose Vaccine 10/31/2022   PFIZER(Purple Top)SARS-COV-2 Vaccination 10/31/2022, 09/12/2023   PNEUMOCOCCAL CONJUGATE-20 10/03/2021   Tdap 12/17/2014   Zoster Recombinant(Shingrix) 03/21/2020    TDAP status: Up to date  Flu Vaccine status: Up to date  Pneumococcal vaccine status: Up to date  Covid-19 vaccine status: Completed vaccines  Qualifies for Shingles Vaccine? Yes   Zostavax completed No   Shingrix Completed?: No.    Education has been provided regarding the importance of this vaccine. Patient has been advised to call insurance company to determine out of pocket expense if they have not yet  received this vaccine. Advised may also receive vaccine at local pharmacy or Health Dept. Verbalized acceptance and understanding.  Screening Tests Health Maintenance  Topic Date Due   Medicare  Annual Wellness (AWV)  09/14/2024   DTaP/Tdap/Td (2 - Td or Tdap) 12/17/2024   Colonoscopy  05/09/2028   Pneumonia Vaccine 23+ Years old  Completed   INFLUENZA VACCINE  Completed   Hepatitis C Screening  Completed   HPV VACCINES  Aged Out   COVID-19 Vaccine  Discontinued   Zoster Vaccines- Shingrix  Discontinued    Health Maintenance  There are no preventive care reminders to display for this patient.   Colorectal cancer screening: Type of screening: Colonoscopy. Completed 2019. Repeat every 10 years  Lung Cancer Screening: (Low Dose CT Chest recommended if Age 5-80 years, 20 pack-year currently smoking OR have quit w/in 15years.) does not qualify.   Lung Cancer Screening Referral:   Additional Screening:  Hepatitis C Screening: does not qualify; Completed 2018  Vision Screening: Recommended annual ophthalmology exams for early detection of glaucoma and other disorders of the eye. Is the patient up to date with their annual eye exam?  Yes  Who is the provider or what is the name of the office in which the patient attends annual eye exams? Has readers does not have any issues If pt is not established with a provider, would they like to be referred to a provider to establish care? No .   Dental Screening: Recommended annual dental exams for proper oral hygiene   Community Resource Referral / Chronic Care Management: CRR required this visit?  No   CCM required this visit?  No     Plan:     I have personally reviewed and noted the following in the patient's chart:   Medical and social history Use of alcohol, tobacco or illicit drugs  Current medications and supplements including opioid prescriptions. Patient is not currently taking opioid prescriptions. Functional ability and status Nutritional status Physical activity Advanced directives List of other physicians Hospitalizations, surgeries, and ER visits in previous 12 months Vitals Screenings to include  cognitive, depression, and falls Referrals and appointments  In addition, I have reviewed and discussed with patient certain preventive protocols, quality metrics, and best practice recommendations. A written personalized care plan for preventive services as well as general preventive health recommendations were provided to patient.     Remi Haggard, LPN [phone]  After Visit Summary: (MyChart) Due to this being a telephonic visit, the after visit summary with patients personalized plan was offered to patient via MyChart   Nurse Notes:

## 2023-10-05 ENCOUNTER — Other Ambulatory Visit (INDEPENDENT_AMBULATORY_CARE_PROVIDER_SITE_OTHER): Payer: Medicare Other

## 2023-10-05 DIAGNOSIS — R972 Elevated prostate specific antigen [PSA]: Secondary | ICD-10-CM | POA: Diagnosis not present

## 2023-10-05 LAB — PSA: PSA: 3.46 ng/mL (ref 0.10–4.00)

## 2023-10-06 ENCOUNTER — Telehealth: Payer: Self-pay

## 2023-10-06 NOTE — Telephone Encounter (Signed)
Pt has reviewed via MyChart

## 2023-10-06 NOTE — Telephone Encounter (Signed)
-----   Message from Neena Rhymes sent at 10/06/2023  7:43 AM EDT ----- Your PSA is stable- actually it has decreased somewhat.  This is good news!

## 2024-03-13 DIAGNOSIS — Z23 Encounter for immunization: Secondary | ICD-10-CM | POA: Diagnosis not present

## 2024-03-16 ENCOUNTER — Ambulatory Visit (HOSPITAL_BASED_OUTPATIENT_CLINIC_OR_DEPARTMENT_OTHER): Admitting: Orthopaedic Surgery

## 2024-03-16 DIAGNOSIS — G8929 Other chronic pain: Secondary | ICD-10-CM | POA: Diagnosis not present

## 2024-03-16 DIAGNOSIS — M25561 Pain in right knee: Secondary | ICD-10-CM

## 2024-03-16 MED ORDER — LIDOCAINE HCL 1 % IJ SOLN
4.0000 mL | INTRAMUSCULAR | Status: AC | PRN
  Administered 2024-03-16: 4 mL

## 2024-03-16 MED ORDER — TRIAMCINOLONE ACETONIDE 40 MG/ML IJ SUSP
80.0000 mg | INTRAMUSCULAR | Status: AC | PRN
  Administered 2024-03-16: 80 mg via INTRA_ARTICULAR

## 2024-03-16 NOTE — Progress Notes (Signed)
 Chief Complaint: Right knee pain     History of Present Illness:   03/16/2024: Presents today for follow-up of his right knee.  He did get extremely good relief from an injection for over 1 year.  He is hoping to have an additional injection.  Louis Fox is a 68 y.o. male presents today with ongoing right knee pain which has been chronic in nature for the last 10 years.  He was previously told on years prior that he did have a medial meniscal tear for which no intervention was needed.  He is here today for further assessment.  He does overall walk with a limp favoring his right side.  His tenderness is predominantly medially.  He is complaining of swelling and Baker's cyst in the posterior aspect of the knee.  He is occasionally having a popping with extension.  He does enjoy staying active and walking daily.    Surgical History:   none  PMH/PSH/Family History/Social History/Meds/Allergies:    Past Medical History:  Diagnosis Date   Arthritis    Family history of punctured lung    patient with hx of puncture lung as a teenager - left lung    GERD (gastroesophageal reflux disease)    hx of   History of kidney cancer    Pancreatitis    Pseudogout    Past Surgical History:  Procedure Laterality Date   ESOPHAGOGASTRODUODENOSCOPY N/A 09/28/2015   Procedure: ESOPHAGOGASTRODUODENOSCOPY (EGD);  Surgeon: Louis Meckel, MD;  Location: [facility] ENDOSCOPY;  Service: Endoscopy;  Laterality: N/A;   INGUINAL HERNIA REPAIR Right 01/06/2023   Procedure: OPEN RIGHT INGUINAL HERNIA REPAIR WITH MESH;  Surgeon: Manus Rudd, MD;  Location: [facility];  Service: General;  Laterality: Right;   ROBOT ASSISTED LAPAROSCOPIC NEPHRECTOMY Right 11/25/2015   Procedure: ROBOTIC ASSISTED LAPAROSCOPIC  PARTIAL NEPHRECTOMY;  Surgeon: Heloise Purpura, MD;  Location: [facility];  Service: Urology;  Laterality: Right;   umblical hernia     Social History    Socioeconomic History   Marital status: Married    Spouse name: Not on file   Number of children: Not on file   Years of education: Not on file   Highest education level: Not on file  Occupational History   Not on file  Tobacco Use   Smoking status: Never   Smokeless tobacco: Never  Vaping Use   Vaping status: Never Used  Substance and Sexual Activity   Alcohol use: Yes    Comment: daily beer   Drug use: No   Sexual activity: Not Currently  Other Topics Concern   Not on file  Social History Narrative   Not on file   Social Drivers of Health   Financial Resource Strain: Low Risk  (09/15/2023)   Overall Financial Resource Strain (CARDIA)    Difficulty of Paying Living Expenses: Not hard at all  Food Insecurity: No Food Insecurity (09/15/2023)   Hunger Vital Sign    Worried About Running Out of Food in the Last Year: Never true    Ran Out of Food in the Last Year: Never true  Transportation Needs: No Transportation Needs (09/15/2023)   PRAPARE - Administrator, Civil Service (Medical): No    Lack of Transportation (Non-Medical): No  Physical Activity: Sufficiently Active (09/15/2023)   Exercise  Vital Sign    Days of Exercise per Week: 4 days    Minutes of Exercise per Session: 50 min  Stress: No Stress Concern Present (09/15/2023)   [employer] of Occupational Health - Occupational Stress Questionnaire    Feeling of Stress : Not at all  Social Connections: Moderately Integrated (09/15/2023)   Social Connection and Isolation Panel [NHANES]    Frequency of Communication with Friends and Family: More than three times a week    Frequency of Social Gatherings with Friends and Family: Twice a week    Attends Religious Services: Never    Database administrator or Organizations: Yes    Attends Engineer, structural: More than 4 times per year    Marital Status: Married   Family History  Problem Relation Age of Onset   Stroke Mother    Hypertension  Brother    Cancer Brother        kidney   Allergies  Allergen Reactions   Shellfish Allergy Swelling   Fenofibrate Other (See Comments)    Diffuse joint pains   Current Outpatient Medications  Medication Sig Dispense Refill   acetaminophen (TYLENOL) 500 MG tablet Take 1,000 mg by mouth daily as needed for mild pain.     ibuprofen (ADVIL) 600 MG tablet Take 600 mg by mouth every 6 (six) hours as needed.     TURMERIC PO Take by mouth.     No current facility-administered medications for this visit.   No results found.  Review of Systems:   A ROS was performed including pertinent positives and negatives as documented in the HPI.  Physical Exam :   Constitutional: NAD and appears stated age Neurological: Alert and oriented Psych: Appropriate affect and cooperative There were no vitals taken for this visit.   Comprehensive Musculoskeletal Exam:      Musculoskeletal Exam  Gait Normal  Alignment Normal   Right Left  Inspection Normal Normal  Palpation    Tenderness Right medial joint none  Crepitus None None  Effusion Positive none  Range of Motion    Extension 0 0  Flexion 135 135  Strength    Extension 5/5 5/5  Flexion 5/5 5/5  Ligament Exam     Generalized Laxity No No  Lachman Negative Negative   Pivot Shift Negative Negative  Anterior Drawer Negative Negative  Valgus at 0 Negative Negative  Valgus at 20 Negative Negative  Varus at 0 0 0  Varus at 20   0 0  Posterior Drawer at 90 0 0  Vascular/Lymphatic Exam    Edema None None  Venous Stasis Changes No No  Distal Circulation Normal Normal  Neurologic    Light Touch Sensation Intact Intact  Special Tests:      Imaging:   Xray (right knee 4 views): Medial joint space narrowing consistent with moderate osteoarthritis with lateral chondrocalcinosis consistent with pseudogout   I personally reviewed and interpreted the radiographs.   Assessment:   68 y.o. 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  I did describe that this is likely accounting for swelling in the majority of his mechanical symptoms.  That being said he has not had any injections and I do believe that he would get quite significant relief from an injection.  He would like to proceed with this.  I do believe an external referral for therapy for strengthening of the hip and quad is  also ideal to help him optimize the strength of his right knee.  I will plan to see him back on an as-needed basis  Plan :    -   Procedure Note  Patient: ISMEAL HEIDER             Date of Birth: 30-Aug-1956           MRN: [id number]             Visit Date: 03/16/2024  Procedures: Visit Diagnoses:  No diagnosis found.   Large Joint Inj: R knee on 03/16/2024 11:11 AM Indications: pain Details: 22 G 1.5 in needle, ultrasound-guided anterior approach  Arthrogram: No  Medications: 4 mL lidocaine 1 %; 80 mg triamcinolone acetonide 40 MG/ML Outcome: tolerated well, no immediate complications Procedure, treatment alternatives, risks and benefits explained, specific risks discussed. Consent was given by the patient. Immediately prior to procedure a time out was called to verify the correct patient, procedure, equipment, support staff and site/side marked as required. Patient was prepped and draped in the usual sterile fashion.           I personally saw and evaluated the patient, and participated in the management and treatment plan.  Huel Cote, MD Attending Physician, Orthopedic Surgery  This document was dictated using Dragon voice recognition software. A reasonable attempt at proof reading has been made to minimize errors.

## 2024-04-04 ENCOUNTER — Ambulatory Visit (INDEPENDENT_AMBULATORY_CARE_PROVIDER_SITE_OTHER): Payer: BLUE CROSS/BLUE SHIELD | Admitting: Family Medicine

## 2024-04-04 ENCOUNTER — Encounter: Payer: Self-pay | Admitting: Family Medicine

## 2024-04-04 VITALS — BP 122/68 | HR 78 | Temp 97.8°F | Ht 67.0 in | Wt 153.2 lb

## 2024-04-04 DIAGNOSIS — E785 Hyperlipidemia, unspecified: Secondary | ICD-10-CM | POA: Diagnosis not present

## 2024-04-04 DIAGNOSIS — E663 Overweight: Secondary | ICD-10-CM | POA: Diagnosis not present

## 2024-04-04 DIAGNOSIS — F1090 Alcohol use, unspecified, uncomplicated: Secondary | ICD-10-CM

## 2024-04-04 LAB — BASIC METABOLIC PANEL WITH GFR
BUN: 21 mg/dL (ref 6–23)
CO2: 27 meq/L (ref 19–32)
Calcium: 10.2 mg/dL (ref 8.4–10.5)
Chloride: 102 meq/L (ref 96–112)
Creatinine, Ser: 0.89 mg/dL (ref 0.40–1.50)
GFR: 87.6 mL/min (ref >60.00)
Glucose, Bld: 79 mg/dL (ref 70–99)
Potassium: 4.8 meq/L (ref 3.5–5.1)
Sodium: 139 meq/L (ref 135–145)

## 2024-04-04 LAB — LIPID PANEL
Cholesterol: 207 mg/dL — ABNORMAL HIGH (ref 0–200)
HDL: 51.5 mg/dL (ref >39.00)
LDL Cholesterol: 118 mg/dL — ABNORMAL HIGH (ref 0–99)
NonHDL: 155.5
Total CHOL/HDL Ratio: 4
Triglycerides: 186 mg/dL — ABNORMAL HIGH (ref 0.0–149.0)
VLDL: 37.2 mg/dL (ref 0.0–40.0)

## 2024-04-04 LAB — CBC WITH DIFFERENTIAL/PLATELET
Basophils Absolute: 0.1 10*3/uL (ref 0.0–0.1)
Basophils Relative: 0.9 % (ref 0.0–3.0)
Eosinophils Absolute: 0.1 10*3/uL (ref 0.0–0.7)
Eosinophils Relative: 1.3 % (ref 0.0–5.0)
HCT: 47.1 % (ref 39.0–52.0)
Hemoglobin: 16 g/dL (ref 13.0–17.0)
Lymphocytes Relative: 25.2 % (ref 12.0–46.0)
Lymphs Abs: 2.2 10*3/uL (ref 0.7–4.0)
MCHC: 33.9 g/dL (ref 30.0–36.0)
MCV: 91.5 fl (ref 78.0–100.0)
Monocytes Absolute: 0.7 10*3/uL (ref 0.1–1.0)
Monocytes Relative: 7.5 % (ref 3.0–12.0)
Neutro Abs: 5.7 10*3/uL (ref 1.4–7.7)
Neutrophils Relative %: 65.1 % (ref 43.0–77.0)
Platelets: 294 10*3/uL (ref 150.0–400.0)
RBC: 5.15 Mil/uL (ref 4.22–5.81)
RDW: 13 % (ref 11.5–15.5)
WBC: 8.7 10*3/uL (ref 4.0–10.5)

## 2024-04-04 LAB — HEPATIC FUNCTION PANEL
ALT: 15 U/L (ref 0–53)
AST: 16 U/L (ref 0–37)
Albumin: 4.9 g/dL (ref 3.5–5.2)
Alkaline Phosphatase: 51 U/L (ref 39–117)
Bilirubin, Direct: 0.1 mg/dL (ref 0.0–0.3)
Total Bilirubin: 0.6 mg/dL (ref 0.2–1.2)
Total Protein: 7.2 g/dL (ref 6.0–8.3)

## 2024-04-04 LAB — TSH: TSH: 3.35 u[IU]/mL (ref 0.35–5.50)

## 2024-04-04 MED ORDER — SILDENAFIL CITRATE 100 MG PO TABS: 50.0000 mg | ORAL_TABLET | Freq: Every day | ORAL | 6 refills | Status: AC | PRN

## 2024-04-04 NOTE — Assessment & Plan Note (Signed)
 He has lost 7 lbs since last visit.  BMI now in normal range at 23.99.  Applauded his efforts.  Will continue to follow.

## 2024-04-04 NOTE — Progress Notes (Signed)
   Subjective:    Patient ID: Louis Fox, male    DOB: [birth year], 68 y.o.   MRN: [id number]  HPI Hyperlipidemia- ongoing issue.  Last LDL 76, HDL 48, and Trigs 409.  No CP, SOB, HA's, visual changes, abd pain, N/V.  Overweight- pt is down 7 lbs since last visit.  BMI now 23.99 and in the normal Fox.  Pt has been doing a lot of yard work.  Pt has cut out fried foods and decreased his intake of fatty foods.  Alcohol intake- pt continues to restrict his alcohol intake.  Now drinks ~1 drink/day as compared to multiple drinks daily.   Review of Systems For ROS see HPI     Objective:   Physical Exam Vitals reviewed.  Constitutional:      General: He is not in acute distress.    Appearance: Normal appearance. He is well-developed. He is not ill-appearing.  HENT:     Head: Normocephalic and atraumatic.  Eyes:     Extraocular Movements: Extraocular movements intact.     Conjunctiva/sclera: Conjunctivae normal.     Pupils: Pupils are equal, round, and reactive to light.  Neck:     Thyroid: No thyromegaly.  Cardiovascular:     Rate and Rhythm: Normal rate and regular rhythm.     Pulses: Normal pulses.     Heart sounds: Normal heart sounds. No murmur heard. Pulmonary:     Effort: Pulmonary effort is normal. No respiratory distress.     Breath sounds: Normal breath sounds.  Abdominal:     General: Bowel sounds are normal. There is no distension.     Palpations: Abdomen is soft.  Musculoskeletal:     Cervical back: Normal Fox of motion and neck supple.     Right lower leg: No edema.     Left lower leg: No edema.  Lymphadenopathy:     Cervical: No cervical adenopathy.  Skin:    General: Skin is warm and dry.  Neurological:     General: No focal deficit present.     Mental Status: He is alert and oriented to person, place, and time.     Cranial Nerves: No cranial nerve deficit.  Psychiatric:        Mood and Affect: Mood normal.        Behavior: Behavior normal.            Assessment & Plan:

## 2024-04-04 NOTE — Assessment & Plan Note (Signed)
 Pt is now drinking ~1 drink/day- which is much less than previous.  Applauded his efforts.

## 2024-04-04 NOTE — Assessment & Plan Note (Signed)
 Chronic problem.  Last labs showed that LDL was well controlled but Trigs were elevated.  Since then, he has lost 7 lbs.  Check labs and see if meds are needed.

## 2024-04-04 NOTE — Patient Instructions (Addendum)
 Follow up in 1 year or as needed We'll notify you of your lab results and make any changes if needed Keep up the good work on healthy diet and regular exercise- you're doing great!! Start the Sildenafil- 1/2 tab to start, increase to a full tab if needed.  Take at least 1 hr prior to sexual activity and effects can last up to 24 hrs Call with any questions or concerns Stay Safe!  Stay Healthy! ENJOY YOUR TRIP!!!

## 2024-04-05 ENCOUNTER — Telehealth: Payer: Self-pay

## 2024-04-05 ENCOUNTER — Encounter: Payer: Self-pay | Admitting: Family Medicine

## 2024-04-05 NOTE — Telephone Encounter (Signed)
 Pt has reviewed labs via MyChart

## 2024-04-05 NOTE — Telephone Encounter (Signed)
-----   Message from Neena Rhymes sent at 04/05/2024  7:42 AM EDT ----- Labs are stable and look good!  No changes at this time

## 2024-04-24 DIAGNOSIS — F4323 Adjustment disorder with mixed anxiety and depressed mood: Secondary | ICD-10-CM | POA: Diagnosis not present

## 2024-05-02 DIAGNOSIS — F4323 Adjustment disorder with mixed anxiety and depressed mood: Secondary | ICD-10-CM | POA: Diagnosis not present

## 2024-05-09 DIAGNOSIS — F4323 Adjustment disorder with mixed anxiety and depressed mood: Secondary | ICD-10-CM | POA: Diagnosis not present

## 2024-05-16 DIAGNOSIS — F4323 Adjustment disorder with mixed anxiety and depressed mood: Secondary | ICD-10-CM | POA: Diagnosis not present

## 2024-05-23 DIAGNOSIS — F4323 Adjustment disorder with mixed anxiety and depressed mood: Secondary | ICD-10-CM | POA: Diagnosis not present

## 2024-06-08 DIAGNOSIS — F4323 Adjustment disorder with mixed anxiety and depressed mood: Secondary | ICD-10-CM | POA: Diagnosis not present

## 2024-06-20 DIAGNOSIS — F4323 Adjustment disorder with mixed anxiety and depressed mood: Secondary | ICD-10-CM | POA: Diagnosis not present

## 2024-07-04 DIAGNOSIS — F4323 Adjustment disorder with mixed anxiety and depressed mood: Secondary | ICD-10-CM | POA: Diagnosis not present

## 2024-07-18 DIAGNOSIS — F4323 Adjustment disorder with mixed anxiety and depressed mood: Secondary | ICD-10-CM | POA: Diagnosis not present

## 2024-08-21 DIAGNOSIS — M9901 Segmental and somatic dysfunction of cervical region: Secondary | ICD-10-CM | POA: Diagnosis not present

## 2024-08-21 DIAGNOSIS — M542 Cervicalgia: Secondary | ICD-10-CM | POA: Diagnosis not present

## 2024-08-21 DIAGNOSIS — M9902 Segmental and somatic dysfunction of thoracic region: Secondary | ICD-10-CM | POA: Diagnosis not present

## 2024-08-21 DIAGNOSIS — M546 Pain in thoracic spine: Secondary | ICD-10-CM | POA: Diagnosis not present

## 2024-08-23 DIAGNOSIS — M546 Pain in thoracic spine: Secondary | ICD-10-CM | POA: Diagnosis not present

## 2024-08-23 DIAGNOSIS — M9901 Segmental and somatic dysfunction of cervical region: Secondary | ICD-10-CM | POA: Diagnosis not present

## 2024-08-23 DIAGNOSIS — M9902 Segmental and somatic dysfunction of thoracic region: Secondary | ICD-10-CM | POA: Diagnosis not present

## 2024-08-23 DIAGNOSIS — M542 Cervicalgia: Secondary | ICD-10-CM | POA: Diagnosis not present

## 2024-08-29 DIAGNOSIS — F4323 Adjustment disorder with mixed anxiety and depressed mood: Secondary | ICD-10-CM | POA: Diagnosis not present

## 2024-09-07 DIAGNOSIS — Z23 Encounter for immunization: Secondary | ICD-10-CM | POA: Diagnosis not present

## 2024-09-12 DIAGNOSIS — F4323 Adjustment disorder with mixed anxiety and depressed mood: Secondary | ICD-10-CM | POA: Diagnosis not present

## 2024-10-03 DIAGNOSIS — F4323 Adjustment disorder with mixed anxiety and depressed mood: Secondary | ICD-10-CM | POA: Diagnosis not present

## 2024-10-17 DIAGNOSIS — F4323 Adjustment disorder with mixed anxiety and depressed mood: Secondary | ICD-10-CM | POA: Diagnosis not present

## 2024-10-30 ENCOUNTER — Encounter: Payer: Self-pay | Admitting: Radiology

## 2024-10-31 DIAGNOSIS — F4323 Adjustment disorder with mixed anxiety and depressed mood: Secondary | ICD-10-CM | POA: Diagnosis not present

## 2024-11-14 DIAGNOSIS — F4323 Adjustment disorder with mixed anxiety and depressed mood: Secondary | ICD-10-CM | POA: Diagnosis not present

## 2024-11-28 DIAGNOSIS — F4323 Adjustment disorder with mixed anxiety and depressed mood: Secondary | ICD-10-CM | POA: Diagnosis not present

## 2024-12-06 DIAGNOSIS — M9901 Segmental and somatic dysfunction of cervical region: Secondary | ICD-10-CM | POA: Diagnosis not present

## 2024-12-06 DIAGNOSIS — M9902 Segmental and somatic dysfunction of thoracic region: Secondary | ICD-10-CM | POA: Diagnosis not present

## 2024-12-06 DIAGNOSIS — M546 Pain in thoracic spine: Secondary | ICD-10-CM | POA: Diagnosis not present

## 2024-12-06 DIAGNOSIS — M542 Cervicalgia: Secondary | ICD-10-CM | POA: Diagnosis not present

## 2024-12-14 DIAGNOSIS — F4323 Adjustment disorder with mixed anxiety and depressed mood: Secondary | ICD-10-CM | POA: Diagnosis not present

## 2025-01-04 ENCOUNTER — Ambulatory Visit (INDEPENDENT_AMBULATORY_CARE_PROVIDER_SITE_OTHER): Admitting: *Deleted

## 2025-01-04 VITALS — Ht 69.0 in | Wt 153.0 lb

## 2025-01-04 DIAGNOSIS — Z0000 Encounter for general adult medical examination without abnormal findings: Secondary | ICD-10-CM

## 2025-01-04 NOTE — Progress Notes (Signed)
 "  Chief Complaint  Patient presents with   Medicare Wellness     Subjective:   ABDULMALIK Fox is a 69 y.o. male who presents for a Medicare Annual Wellness Visit.  No voiced or noted concerns at this time Patient advised to keep follow-up appointment with PCP (04-10-2024)   Visit info / Clinical Intake: Medicare Wellness Visit Type:: Subsequent Annual Wellness Visit Persons participating in visit and providing information:: patient Medicare Wellness Visit Mode:: Telephone If telephone:: video declined If Telephone or Video please confirm:: I connected with patient using audio/video enable telemedicine. I verified patient identity with two identifiers, discussed telehealth limitations, and patient agreed to proceed. Patient Location:: home Provider Location:: home Interpreter Needed?: No Pre-visit prep was completed: no AWV questionnaire completed by patient prior to visit?: no Living arrangements:: lives with spouse/significant other Patient's Overall Health Status Rating: very good Typical amount of pain: none Does pain affect daily life?: no Are you currently prescribed opioids?: no  Dietary Habits and Nutritional Risks How many meals a day?: 3 Eats fruit and vegetables daily?: yes Most meals are obtained by: preparing own meals In the last 2 weeks, have you had any of the following?: none Diabetic:: no  Functional Status Activities of Daily Living (to include ambulation/medication): Independent Ambulation: Independent Medication Administration: Independent Home Management (perform basic housework or laundry): Independent Manage your own finances?: yes Primary transportation is: driving Concerns about vision?: no *vision screening is required for WTM* Concerns about hearing?: no  Fall Screening Falls in the past year?: 0 Number of falls in past year: 0 Was there an injury with Fall?: 0 Fall Risk Category Calculator: 0 Patient Fall Risk Level: Low Fall  Risk  Fall Risk Patient at Risk for Falls Due to: No Fall Risks Fall risk Follow up: Education provided; Falls evaluation completed; Falls prevention discussed  Home and Transportation Safety: All rugs have non-skid backing?: N/A, no rugs All stairs or steps have railings?: yes Grab bars in the bathtub or shower?: yes Have non-skid surface in bathtub or shower?: yes Good home lighting?: yes Regular seat belt use?: yes Hospital stays in the last year:: no  Cognitive Assessment Difficulty concentrating, remembering, or making decisions? : no Will 6CIT or Mini Cog be Completed: yes What year is it?: 0 points What month is it?: 0 points Give patient an address phrase to remember (5 components): Its very sunny outside today in January About what time is it?: 0 points Count backwards from 20 to 1: 0 points Say the months of the year in reverse: 0 points Repeat the address phrase from earlier: 0 points 6 CIT Score: 0 points  Advance Directives (For Healthcare) Does Patient Have a Medical Advance Directive?: Yes Type of Advance Directive: Healthcare Power of Attorney Copy of Healthcare Power of Attorney in Chart?: No - copy requested  Reviewed/Updated  Reviewed/Updated: Reviewed All (Medical, Surgical, Family, Medications, Allergies, Care Teams, Patient Goals); Surgical History; Family History; Medications; Allergies; Care Teams; Patient Goals; Medical History    Allergies (verified) Shellfish allergy and Fenofibrate    Current Medications (verified) Outpatient Encounter Medications as of 01/04/2025  Medication Sig   acetaminophen  (TYLENOL ) 500 MG tablet Take 1,000 mg by mouth daily as needed for mild pain.   ibuprofen (ADVIL) 600 MG tablet Take 600 mg by mouth every 6 (six) hours as needed.   sildenafil  (VIAGRA ) 100 MG tablet Take 0.5-1 tablets (50-100 mg total) by mouth daily as needed for erectile dysfunction.   TURMERIC PO Take by mouth.  No facility-administered encounter  medications on file as of 01/04/2025.    History: Past Medical History:  Diagnosis Date   Arthritis    Family history of punctured lung    patient with hx of puncture lung as a teenager - left lung    GERD (gastroesophageal reflux disease)    hx of   History of kidney cancer    Pancreatitis    Pseudogout    Past Surgical History:  Procedure Laterality Date   ESOPHAGOGASTRODUODENOSCOPY N/A 09/28/2015   Procedure: ESOPHAGOGASTRODUODENOSCOPY (EGD);  Surgeon: Lamar JONETTA Aho, MD;  Location: [facility] ENDOSCOPY;  Service: Endoscopy;  Laterality: N/A;   INGUINAL HERNIA REPAIR Right 01/06/2023   Procedure: OPEN RIGHT INGUINAL HERNIA REPAIR WITH MESH;  Surgeon: Belinda Cough, MD;  Location: [facility];  Service: General;  Laterality: Right;   ROBOT ASSISTED LAPAROSCOPIC NEPHRECTOMY Right 11/25/2015   Procedure: ROBOTIC ASSISTED LAPAROSCOPIC  PARTIAL NEPHRECTOMY;  Surgeon: Gretel Ferrara, MD;  Location: [facility] OR[facility];  Service: Urology;  Laterality: Right;   umblical hernia     Family History  Problem Relation Age of Onset   Stroke Mother    Hypertension Brother    Cancer Brother        kidney   Social History   Occupational History   Not on file  Tobacco Use   Smoking status: Never   Smokeless tobacco: Never  Vaping Use   Vaping status: Never Used  Substance and Sexual Activity   Alcohol use: Yes    Comment: daily beer   Drug use: No   Sexual activity: Not Currently   Tobacco Counseling Counseling given: Not Answered  SDOH Screenings   Food Insecurity: No Food Insecurity (01/04/2025)  Housing: Unknown (01/04/2025)  Transportation Needs: No Transportation Needs (01/04/2025)  Utilities: Not At Risk (01/04/2025)  Alcohol Screen: Low Risk (09/15/2023)  Depression (PHQ2-9): Low Risk (01/04/2025)  Financial Resource Strain: Low Risk (09/15/2023)  Physical Activity: Sufficiently Active (01/04/2025)  Social Connections: Moderately Integrated (01/04/2025)  Stress: No Stress Concern Present  (01/04/2025)  Tobacco Use: Low Risk (04/04/2024)  Health Literacy: Adequate Health Literacy (01/04/2025)   See flowsheets for full screening details  Depression Screen PHQ 2 & 9 Depression Scale- Over the past 2 weeks, how often have you been bothered by any of the following problems? Little interest or pleasure in doing things: 0 Feeling down, depressed, or hopeless (PHQ Adolescent also includes...irritable): 0 PHQ-2 Total Score: 0 Trouble falling or staying asleep, or sleeping too much: 0 Feeling tired or having little energy: 0 Poor appetite or overeating (PHQ Adolescent also includes...weight loss): 0 Feeling bad about yourself - or that you are a failure or have let yourself or your family down: 0 Trouble concentrating on things, such as reading the newspaper or watching television (PHQ Adolescent also includes...like school work): 0 Moving or speaking so slowly that other people could have noticed. Or the opposite - being so fidgety or restless that you have been moving around a lot more than usual: 0 Thoughts that you would be better off dead, or of hurting yourself in some way: 0 PHQ-9 Total Score: 0 If you checked off any problems, how difficult have these problems made it for you to do your work, take care of things at home, or get along with other people?: Not difficult at all     Goals Addressed             This Visit's Progress    DIET - INCREASE WATER  INTAKE  On track    Patient Stated       Enjoy travel             Objective:    Today's Vitals   01/04/25 0811  Weight: 153 lb (69.4 kg)  Height: 5' 9 (1.753 m)   Body mass index is 22.59 kg/m.  Hearing/Vision screen Hearing Screening - Comments:: No trouble hearing Vision Screening - Comments:: [place] Up to date Immunizations and Health Maintenance Health Maintenance  Topic Date Due   Zoster Vaccines- Shingrix (2 of 2) 05/16/2020   Medicare Annual Wellness (AWV)  01/04/2026   Colonoscopy  05/09/2028    DTaP/Tdap/Td (3 - Td or Tdap) 10/03/2031   Pneumococcal Vaccine: 50+ Years  Completed   Influenza Vaccine  Completed   Hepatitis C Screening  Completed   Meningococcal B Vaccine  Aged Out   COVID-19 Vaccine  Discontinued        Assessment/Plan:  This is a routine wellness examination for [place].  Patient Care Team: Mahlon Comer BRAVO, MD as PCP - General (Family Medicine) Renda Glance, MD as Consulting Physician (Urology) Rollin Dover, MD as Consulting Physician (Gastroenterology) Genelle Standing, MD as Consulting Physician (Orthopedic Surgery)  I have personally reviewed and noted the following in the patients chart:   Medical and social history Use of alcohol, tobacco or illicit drugs  Current medications and supplements including opioid prescriptions. Functional ability and status Nutritional status Physical activity Advanced directives List of other physicians Hospitalizations, surgeries, and ER visits in previous 12 months Vitals Screenings to include cognitive, depression, and falls Referrals and appointments  No orders of the defined types were placed in this encounter.  In addition, I have reviewed and discussed with patient certain preventive protocols, quality metrics, and best practice recommendations. A written personalized care plan for preventive services as well as general preventive health recommendations were provided to patient.   Jakya Dovidio, LPN   07/28/7972   Return in 1 year (on 01/04/2026).  After Visit Summary: (MyChart) Due to this being a telephonic visit, the after visit summary with patients personalized plan was offered to patient via MyChart   Nurse Notes:  "

## 2025-01-04 NOTE — Patient Instructions (Signed)
 [name],  Thank you for taking the time for your Medicare Wellness Visit. I appreciate your continued commitment to your health goals. Please review the care plan we discussed, and feel free to reach out if I can assist you further.  Please note that Annual Wellness Visits do not include a physical exam. Some assessments may be limited, especially if the visit was conducted virtually. If needed, we may recommend an in-person follow-up with your provider.  Ongoing Care Seeing your primary care provider every 3 to 6 months helps us  monitor your health and provide consistent, personalized care.   Referrals If a referral was made during today's visit and you haven't received any updates within two weeks, please contact the referred provider directly to check on the status.  Recommended Screenings:  Health Maintenance  Topic Date Due   Zoster (Shingles) Vaccine (2 of 2) 05/16/2020   Medicare Annual Wellness Visit  01/04/2026   Colon Cancer Screening  05/09/2028   DTaP/Tdap/Td vaccine (3 - Td or Tdap) 10/03/2031   Pneumococcal Vaccine for age over 59  Completed   Flu Shot  Completed   Hepatitis C Screening  Completed   Meningitis B Vaccine  Aged Out   COVID-19 Vaccine  Discontinued       01/04/2025    8:13 AM  Advanced Directives  Does Patient Have a Medical Advance Directive? Yes  Type of Advance Directive Healthcare Power of Attorney  Copy of Healthcare Power of Attorney in Chart? No - copy requested    Vision: Annual vision screenings are recommended for early detection of glaucoma, cataracts, and diabetic retinopathy. These exams can also reveal signs of chronic conditions such as diabetes and high blood pressure.  Dental: Annual dental screenings help detect early signs of oral cancer, gum disease, and other conditions linked to overall health, including heart disease and diabetes.  Please see the attached documents for additional preventive care recommendations.    Mr.  Louis Fox , Thank you for taking time to come for your Medicare Wellness Visit. I appreciate your ongoing commitment to your health goals. Please review the following plan we discussed and let me know if I can assist you in the future.   Screening recommendations/referrals: Colonoscopy:  Recommended yearly ophthalmology/optometry visit for glaucoma screening and checkup Recommended yearly dental visit for hygiene and checkup  Vaccinations: Influenza vaccine:  Pneumococcal vaccine:  Tdap vaccine:  Shingles vaccine:       Preventive Care 65 Years and Older, Male Preventive care refers to lifestyle choices and visits with your health care provider that can promote health and wellness. What does preventive care include? A yearly physical exam. This is also called an annual well check. Dental exams once or twice a year. Routine eye exams. Ask your health care provider how often you should have your eyes checked. Personal lifestyle choices, including: Daily care of your teeth and gums. Regular physical activity. Eating a healthy diet. Avoiding tobacco and drug use. Limiting alcohol use. Practicing safe sex. Taking low doses of aspirin every day. Taking vitamin and mineral supplements as recommended by your health care provider. What happens during an annual well check? The services and screenings done by your health care provider during your annual well check will depend on your age, overall health, lifestyle risk factors, and family history of disease. Counseling  Your health care provider may ask you questions about your: Alcohol use. Tobacco use. Drug use. Emotional well-being. Home and relationship well-being. Sexual activity. Eating habits. History of  falls. Memory and ability to understand (cognition). Work and work astronomer. Screening  You may have the following tests or measurements: Height, weight, and BMI. Blood pressure. Lipid and cholesterol levels. These may  be checked every 5 years, or more frequently if you are over 89 years old. Skin check. Lung cancer screening. You may have this screening every year starting at age 23 if you have a 30-pack-year history of smoking and currently smoke or have quit within the past 15 years. Fecal occult blood test (FOBT) of the stool. You may have this test every year starting at age 54. Flexible sigmoidoscopy or colonoscopy. You may have a sigmoidoscopy every 5 years or a colonoscopy every 10 years starting at age 69. Prostate cancer screening. Recommendations will vary depending on your family history and other risks. Hepatitis C blood test. Hepatitis B blood test. Sexually transmitted disease (STD) testing. Diabetes screening. This is done by checking your blood sugar (glucose) after you have not eaten for a while (fasting). You may have this done every 1-3 years. Abdominal aortic aneurysm (AAA) screening. You may need this if you are a current or former smoker. Osteoporosis. You may be screened starting at age 72 if you are at high risk. Talk with your health care provider about your test results, treatment options, and if necessary, the need for more tests. Vaccines  Your health care provider may recommend certain vaccines, such as: Influenza vaccine. This is recommended every year. Tetanus, diphtheria, and acellular pertussis (Tdap, Td) vaccine. You may need a Td booster every 10 years. Zoster vaccine. You may need this after age 50. Pneumococcal 13-valent conjugate (PCV13) vaccine. One dose is recommended after age 54. Pneumococcal polysaccharide (PPSV23) vaccine. One dose is recommended after age 54. Talk to your health care provider about which screenings and vaccines you need and how often you need them. This information is not intended to replace advice given to you by your health care provider. Make sure you discuss any questions you have with your health care provider. Document Released: 01/10/2016  Document Revised: 09/02/2016 Document Reviewed: 10/15/2015 Elsevier Interactive Patient Education  2017 [employer].  Fall Prevention in the Home Falls can cause injuries. They can happen to people of all ages. There are many things you can do to make your home safe and to help prevent falls. What can I do on the outside of my home? Regularly fix the edges of walkways and driveways and fix any cracks. Remove anything that might make you trip as you walk through a door, such as a raised step or threshold. Trim any bushes or trees on the path to your home. Use bright outdoor lighting. Clear any walking paths of anything that might make someone trip, such as rocks or tools. Regularly check to see if handrails are loose or broken. Make sure that both sides of any steps have handrails. Any raised decks and porches should have guardrails on the edges. Have any leaves, snow, or ice cleared regularly. Use sand or salt on walking paths during winter. Clean up any spills in your garage right away. This includes oil or grease spills. What can I do in the bathroom? Use night lights. Install grab bars by the toilet and in the tub and shower. Do not use towel bars as grab bars. Use non-skid mats or decals in the tub or shower. If you need to sit down in the shower, use a plastic, non-slip stool. Keep the floor dry. Clean up any water  that  spills on the floor as soon as it happens. Remove soap buildup in the tub or shower regularly. Attach bath mats securely with double-sided non-slip rug tape. Do not have throw rugs and other things on the floor that can make you trip. What can I do in the bedroom? Use night lights. Make sure that you have a light by your bed that is easy to reach. Do not use any sheets or blankets that are too big for your bed. They should not hang down onto the floor. Have a firm chair that has side arms. You can use this for support while you get dressed. Do not have throw rugs  and other things on the floor that can make you trip. What can I do in the kitchen? Clean up any spills right away. Avoid walking on wet floors. Keep items that you use a lot in easy-to-reach places. If you need to reach something above you, use a strong step stool that has a grab bar. Keep electrical cords out of the way. Do not use floor polish or wax that makes floors slippery. If you must use wax, use non-skid floor wax. Do not have throw rugs and other things on the floor that can make you trip. What can I do with my stairs? Do not leave any items on the stairs. Make sure that there are handrails on both sides of the stairs and use them. Fix handrails that are broken or loose. Make sure that handrails are as long as the stairways. Check any carpeting to make sure that it is firmly attached to the stairs. Fix any carpet that is loose or worn. Avoid having throw rugs at the top or bottom of the stairs. If you do have throw rugs, attach them to the floor with carpet tape. Make sure that you have a light switch at the top of the stairs and the bottom of the stairs. If you do not have them, ask someone to add them for you. What else can I do to help prevent falls? Wear shoes that: Do not have high heels. Have rubber bottoms. Are comfortable and fit you well. Are closed at the toe. Do not wear sandals. If you use a stepladder: Make sure that it is fully opened. Do not climb a closed stepladder. Make sure that both sides of the stepladder are locked into place. Ask someone to hold it for you, if possible. Clearly mark and make sure that you can see: Any grab bars or handrails. First and last steps. Where the edge of each step is. Use tools that help you move around (mobility aids) if they are needed. These include: Canes. Walkers. Scooters. Crutches. Turn on the lights when you go into a dark area. Replace any light bulbs as soon as they burn out. Set up your furniture so you have a  clear path. Avoid moving your furniture around. If any of your floors are uneven, fix them. If there are any pets around you, be aware of where they are. Review your medicines with your doctor. Some medicines can make you feel dizzy. This can increase your chance of falling. Ask your doctor what other things that you can do to help prevent falls. This information is not intended to replace advice given to you by your health care provider. Make sure you discuss any questions you have with your health care provider. Document Released: 10/10/2009 Document Revised: 05/21/2016 Document Reviewed: 01/18/2015 Elsevier Interactive Patient Education  2017 [employer].

## 2025-04-10 ENCOUNTER — Encounter: Admitting: Family Medicine
